# Patient Record
Sex: Male | Born: 1975 | Race: White | Hispanic: No | Smoking: Current every day smoker
Health system: Southern US, Community
[De-identification: ages and names within clinical notes are randomized; demographics above are authoritative.]

## PROBLEM LIST (undated history)

## (undated) DIAGNOSIS — J45909 Unspecified asthma, uncomplicated: Secondary | ICD-10-CM

---

## 2019-04-10 ENCOUNTER — Encounter (HOSPITAL_COMMUNITY): Payer: Self-pay | Admitting: Psychiatry

## 2019-04-10 ENCOUNTER — Other Ambulatory Visit: Payer: Self-pay

## 2019-04-10 ENCOUNTER — Observation Stay (HOSPITAL_COMMUNITY)
Admission: AD | Admit: 2019-04-10 | Discharge: 2019-04-10 | Disposition: A | Discharge status: Other Acute Inpatient Hospital | Payer: Self-pay | Attending: Psychiatry | Admitting: Psychiatry

## 2019-04-10 DIAGNOSIS — F333 Major depressive disorder, recurrent, severe with psychotic symptoms: Secondary | ICD-10-CM

## 2019-04-10 DIAGNOSIS — F209 Schizophrenia, unspecified: Principal | ICD-10-CM | POA: Insufficient documentation

## 2019-04-10 DIAGNOSIS — Z590 Homelessness: Secondary | ICD-10-CM | POA: Insufficient documentation

## 2019-04-10 DIAGNOSIS — Z20822 Contact with and (suspected) exposure to covid-19: Secondary | ICD-10-CM | POA: Insufficient documentation

## 2019-04-10 LAB — COMPREHENSIVE METABOLIC PANEL
ALT: 18 U/L (ref 0–44)
AST: 19 U/L (ref 15–41)
Albumin: 4 g/dL (ref 3.5–5.0)
Alkaline Phosphatase: 78 U/L (ref 38–126)
Anion gap: 8 (ref 5–15)
BUN: 8 mg/dL (ref 6–20)
CO2: 30 mmol/L (ref 22–32)
Calcium: 8.9 mg/dL (ref 8.9–10.3)
Chloride: 103 mmol/L (ref 98–111)
Creatinine, Ser: 0.74 mg/dL (ref 0.61–1.24)
GFR calc Af Amer: >60 mL/min (ref >60)
GFR calc non Af Amer: >60 mL/min (ref >60)
Glucose, Bld: 112 mg/dL — ABNORMAL HIGH (ref 70–99)
Potassium: 4.6 mmol/L (ref 3.5–5.1)
Sodium: 141 mmol/L (ref 135–145)
Total Bilirubin: 0.6 mg/dL (ref 0.3–1.2)
Total Protein: 7.4 g/dL (ref 6.5–8.1)

## 2019-04-10 LAB — CBC
HCT: 49.7 % (ref 39.0–52.0)
Hemoglobin: 16.7 g/dL (ref 13.0–17.0)
MCH: 30.6 pg (ref 26.0–34.0)
MCHC: 33.6 g/dL (ref 30.0–36.0)
MCV: 91 fL (ref 80.0–100.0)
Platelets: 328 10*3/uL (ref 150–400)
RBC: 5.46 MIL/uL (ref 4.22–5.81)
RDW: 13.9 % (ref 11.5–15.5)
WBC: 12.7 10*3/uL — ABNORMAL HIGH (ref 4.0–10.5)
nRBC: 0 % (ref 0.0–0.2)

## 2019-04-10 LAB — RESPIRATORY PANEL BY RT PCR (FLU A&B, COVID)
Influenza A by PCR: NEGATIVE
Influenza B by PCR: NEGATIVE
SARS Coronavirus 2 by RT PCR: NEGATIVE

## 2019-04-10 MED ORDER — ACETAMINOPHEN 325 MG PO TABS: 650.0000 mg | ORAL_TABLET | Freq: Four times a day (QID) | ORAL | Status: DC | PRN

## 2019-04-10 MED ORDER — HYDROXYZINE HCL 25 MG PO TABS
25.0000 mg | ORAL_TABLET | Freq: Three times a day (TID) | ORAL | Status: DC | PRN
  Administered 2019-04-10: 25 mg via ORAL
  Filled 2019-04-10: qty 1

## 2019-04-10 MED ORDER — RISPERIDONE 1 MG PO TABS
1.0000 mg | ORAL_TABLET | Freq: Two times a day (BID) | ORAL | Status: DC
  Administered 2019-04-10: 1 mg via ORAL
  Filled 2019-04-10: qty 1

## 2019-04-10 MED ORDER — MAGNESIUM HYDROXIDE 400 MG/5ML PO SUSP: 30.0000 mL | Freq: Every day | ORAL | Status: DC | PRN

## 2019-04-10 MED ORDER — ALUM & MAG HYDROXIDE-SIMETH 200-200-20 MG/5ML PO SUSP: 30.0000 mL | ORAL | Status: DC | PRN

## 2019-04-10 MED ORDER — TRAZODONE HCL 50 MG PO TABS: 50.0000 mg | ORAL_TABLET | Freq: Every evening | ORAL | Status: DC | PRN

## 2019-04-10 NOTE — Progress Notes (Signed)
Nira Conn, NP recommends inpt tx. TTS to seek placement. Pt to be admitted to [facility] OBS unit to await placement.

## 2019-04-10 NOTE — Progress Notes (Signed)
Patient ID: Ricky Watson, male   DOB: August 04, 1975, 44 y.o.   MRN: [id number]  Pt alert and oriented on the unit. Report given to Larita Fife, Charity fundraiser at [facility]. Safe transport called. Education, support, and encouragement provided, and pt's belongings in locker returned. Pt ambulatory on and off unit. Pt discharged to lobby with transport driver.

## 2019-04-10 NOTE — Progress Notes (Signed)
Pt accepted to [facility]  Dr. Angela Burke is the accepting provider.  Call report to [phone] Pt is Voluntary Pt may be transported by [employer] Pt scheduled to arrive as soon as transport has arrived.    Ruthann Cancer MSW, [facility] Clincal Social Worker Disposition  [facility] Ph: [phone] Fax: [phone]  04/10/2019 11:51 AM

## 2019-04-10 NOTE — Progress Notes (Signed)
Patient meets criteria for inpatient treatment per Berneice Heinrich, NP. No appropriate beds at [facility] currently. CSW faxed referrals to the following facilities for review:  [facility]   [facility]  [facility]  [facility]  [facility]  [facility]  [facility]  [facility]  [facility]  [facility]   [facility]  [facility]  [facility]   TTS will continue to seek bed placement.     Ruthann Cancer MSW, [facility] Clincal Social Worker Disposition  [facility] Ph: [phone] Fax: [phone] 04/10/2019 11:28 AM

## 2019-04-10 NOTE — Plan of Care (Signed)
[facility] Observation Crisis Plan  Reason for Crisis Plan:  Crisis Stabilization   Plan of Care:  Referral for Inpatient Hospitalization  Family Support:      Current Living Environment:  Living Arrangements: Alone  Insurance:   Hospital Account    Name Acct ID Class Status Primary Coverage   Ricky Watson [id number] BEHAVIORAL HEALTH OBSERVATION Open None        Guarantor Account (for Hospital Account [id number])    Name Relation to Pt Service Area Active? Acct Type   Ricky Watson, Will Self [facility] Yes Behavioral Health   Address Phone       [place], [place] [address] [phone](H)          Coverage Information (for Hospital Account [id number])    Not on file      Legal Guardian:     Primary Care Provider:  Patient, No Pcp Per  Current Outpatient Providers:  none  Psychiatrist:  Name of Psychiatrist: none  Counselor/Therapist:  Name of Therapist: none  Compliant with Medications:  No  Additional Information:   Ricky Watson 3/27/20216:19 AM
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
## 2019-04-10 NOTE — Progress Notes (Signed)
Patient ID: Ricky Watson, male   DOB: 1975-07-26, 44 y.o.   MRN: [id number] Pt A&O x 4, presents with SI, plan to cut his wrists.  Feeling hopeless.  Pt recently relocated from [place], and reports being laid off from job.  Pt reports he is homeless.  Denies HI, positive AVH.  Skin search completed.  Monitoring for safety.  Pt calm & cooperative, no distress noted.

## 2019-04-10 NOTE — H&P (Signed)
[facility] Observation Unit Provider Admission PAA/H&P  Patient Identification: Kashmere Staffa MRN:  [id number] Date of Evaluation:  04/10/2019 Chief Complaint:  Schizophrenia ([place]) [F20.9] Principal Diagnosis: Schizophrenia ([place]) Diagnosis:  Principal Problem:   Schizophrenia ([place])  History of Present Illness:  Ricky Assessment:  Ricky Watson is an 44 y.o. male who presents to [facility] as a walk in. Pt reports he has been experiencing SI with a plan to cut his wrists. Pt states he feels depressed and suicidal due to feelings of worthlessness, low mood, feeling hopeless, and frequent substance abuse. Pt states he moved to [place] from [place] in January to attend a sober living house. 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 Pt states he is now homeless. Pt states he has no supports or resources and has no reason to continue living. Pt states he has been experiencing AH that tell him do things. Pt also reports he experiences VH of deceased people. Pt states he feels paranoid and feels that people are after him. Pt states he does not have a current [place] provider, however he did see a [facility] provider in [place], [place].    Evaluation on Unit: Reviewed Ricky assessment and validated with patient. On evaluation patient is alert and oriented x 4, pleasant, and cooperative. Speech is clear and coherent. Mood is depressed and affect is congruent with mood. Thought process is coherent. States that he is hearing voices that tell him to kill himself. States that the voices have worsened over the past 3 weeks. No indication that he is responding to internal stimuli.  Reports suicidal ideations with plans to cut his wrists. Denies homicidal ideations. Reports occasional use of alcohol and methamphetamine. Reports last use of meth was 3 weeks ago. Reports that he last had a beer around 6 am on 04/09/2019.  Reports that he was taking Seroquel and trazodone although he does not recall the doses. States  that he has not taken medications in at least 3 weeks. Reports medical history of asthma.   Associated Signs/Symptoms: Depression Symptoms:  depressed mood, insomnia, feelings of worthlessness/guilt, difficulty concentrating, hopelessness, suicidal thoughts with specific plan, (Hypo) Manic Symptoms:  Hallucinations, Anxiety Symptoms:  Excessive Worry, Psychotic Symptoms:  Hallucinations: Auditory PTSD Symptoms: Negative Total Time spent with patient: 30 minutes  Past Psychiatric History: Reports history of schizophrenia and ADHD. No history of treatment at [facility] No records located in [place].   Is the patient at risk to self? Yes.    Has the patient been a risk to self in the past 6 months? No.  Has the patient been a risk to self within the distant past? Yes.    Is the patient a risk to others? No.  Has the patient been a risk to others in the past 6 months? No.  Has the patient been a risk to others within the distant past? No.   Prior Inpatient Therapy: Prior Inpatient Therapy: No Prior Outpatient Therapy: Prior Outpatient Therapy: Yes Prior Therapy Dates: unk Prior Therapy Facilty/Provider(s): facility in [place] Reason for Treatment: [place] issues Does patient have an ACCT team?: No Does patient have Intensive In-House Services?  : No Does patient have [facility] services? : No Does patient have P4CC services?: No  Alcohol Screening:   Substance Abuse History in the last 12 months:  Yes.   Consequences of Substance Abuse: Medical Consequences:  possible psyhcotic symptoms Previous Psychotropic Medications: Yes  Psychological Evaluations: Yes  Past Medical  History: History reviewed. No pertinent past medical history. History reviewed. No pertinent surgical history. Family History: History reviewed. No pertinent family history. Family Psychiatric History: unknown Tobacco Screening:   Social History:  Social History   Substance and Sexual Activity  Alcohol  Use None     Social History   Substance and Sexual Activity  Drug Use Not on file    Additional Social History: Marital status: Separated    Pain Medications: See MAR Prescriptions: See MAR Over the Counter: See MAR History of alcohol / drug use?: Yes Longest period of sobriety (when/how long): 2 years Negative Consequences of Use: Personal relationships Withdrawal Symptoms: Patient aware of relationship between substance abuse and physical/medical complications Name of Substance 1: Meth 1 - Age of First Use: 13 1 - Amount (size/oz): varies 1 - Frequency: weekly 1 - Duration: ongoing 1 - Last Use / Amount: 5 days ago Name of Substance 2: Alcohol 2 - Age of First Use: 9 2 - Amount (size/oz): 1 beer 2 - Frequency: rare 2 - Duration: ongoing 2 - Last Use / Amount: 04/09/2019                Allergies:  No Known Allergies Lab Results: No results found for this or any previous visit (from the past 48 hour(s)).  Blood Alcohol level:  No results found for: [facility]  Metabolic Disorder Labs:  No results found for: HGBA1C, MPG No results found for: PROLACTIN No results found for: CHOL, TRIG, HDL, CHOLHDL, VLDL, LDLCALC  Current Medications: Current Facility-Administered Medications  Medication Dose Route Frequency Provider Last Rate Last Admin  . acetaminophen (TYLENOL) tablet 650 mg  650 mg Oral Q6H PRN Jackelyn Poling, NP      . alum & mag hydroxide-simeth (MAALOX/MYLANTA) 200-200-20 MG/5ML suspension 30 mL  30 mL Oral Q4H PRN Nira Conn A, NP      . hydrOXYzine (ATARAX/VISTARIL) tablet 25 mg  25 mg Oral TID PRN Nira Conn A, NP      . magnesium hydroxide (MILK OF MAGNESIA) suspension 30 mL  30 mL Oral Daily PRN Nira Conn A, NP      . traZODone (DESYREL) tablet 50 mg  50 mg Oral QHS PRN Nira Conn A, NP       PTA Medications: No medications prior to admission.    Musculoskeletal: Strength & Muscle Tone: within normal limits Gait & Station: normal Patient  leans: N/A  Psychiatric Specialty Exam: Physical Exam  Constitutional: He is oriented to person, place, and time. He appears well-developed and well-nourished. No distress.  HENT:  Head: Normocephalic and atraumatic.  Right Ear: External ear normal.  Left Ear: External ear normal.  Eyes: Pupils are equal, round, and reactive to light. Right eye exhibits no discharge. Left eye exhibits no discharge.  Cardiovascular: Normal rate.  Respiratory: Effort normal. No respiratory distress.  Musculoskeletal:        General: Normal range of motion.  Neurological: He is alert and oriented to person, place, and time.  Skin: Skin is warm and dry. He is not diaphoretic.  Psychiatric: His mood appears anxious. He is not agitated, not aggressive and not withdrawn. Thought content is not paranoid and not delusional. He exhibits a depressed mood. He expresses suicidal ideation. He expresses no homicidal ideation. He expresses suicidal plans.    Review of Systems  Constitutional: Negative for activity change, appetite change, chills, diaphoresis, fatigue, fever and unexpected weight change.  HENT: Negative for congestion and sore throat.  Respiratory: Negative for cough and shortness of breath.   Cardiovascular: Negative for chest pain and palpitations.  Gastrointestinal: Negative for diarrhea, nausea and vomiting.  Psychiatric/Behavioral: Positive for decreased concentration, dysphoric mood, hallucinations, sleep disturbance and suicidal ideas. The patient is nervous/anxious.   All other systems reviewed and are negative.   Blood pressure 109/87, pulse (!) 111, temperature 98.2 F (36.8 C), temperature source Oral, resp. rate 20.There is no height or weight on file to calculate BMI.  General Appearance: Casual and Fairly Groomed  Eye Contact:  Fair  Speech:  Clear and Coherent and Normal Rate  Volume:  Decreased  Mood:  Anxious, Depressed, Hopeless and Worthless  Affect:  Congruent and Depressed   Thought Process:  Coherent, Goal Directed, Linear and Descriptions of Associations: Intact  Orientation:  Full (Time, Place, and Person)  Thought Content:  Hallucinations: Command:  voices tell him to kill himself  Suicidal Thoughts:  Yes.  with intent/plan  Homicidal Thoughts:  No  Memory:  Immediate;   Fair Recent;   Fair Remote;   Fair  Judgement:  Impaired  Insight:  Lacking  Psychomotor Activity:  Normal  Concentration:  Concentration: Fair and Attention Span: Fair  Recall:  [employer] of Knowledge:  Good  Language:  Good  Akathisia:  Negative  Handed:  Right  AIMS (if indicated):     Assets:  Communication Skills Desire for Improvement Leisure Time Physical Health  ADL's:  Intact  Cognition:  WNL  Sleep:         Treatment Plan Summary: Daily contact with patient to assess and evaluate symptoms and progress in treatment and Medication management  Observation Level/Precautions:  15 minute checks Laboratory:  CBC Chemistry Profile UDS Psychotherapy:  Individual Medications:   Hydroxyzine 25 mg TID prn for anxiety Trazodone 50 mg QHS prn for sleep Consultations:  social work Discharge Concerns:  Safety, medication adeherence Estimated LOS: Other:      Jackelyn Poling, NP 3/27/20216:20 AM

## 2019-05-13 ENCOUNTER — Ambulatory Visit (HOSPITAL_COMMUNITY)
Admission: RE | Admit: 2019-05-13 | Discharge: 2019-05-13 | Disposition: A | Discharge status: Home / Self Care | Payer: Self-pay | Attending: Psychiatry | Admitting: Psychiatry

## 2019-05-13 DIAGNOSIS — R45851 Suicidal ideations: Secondary | ICD-10-CM | POA: Insufficient documentation

## 2019-05-13 DIAGNOSIS — F1510 Other stimulant abuse, uncomplicated: Secondary | ICD-10-CM | POA: Insufficient documentation

## 2019-05-13 DIAGNOSIS — Z590 Homelessness: Secondary | ICD-10-CM | POA: Insufficient documentation

## 2019-05-13 DIAGNOSIS — Z915 Personal history of self-harm: Secondary | ICD-10-CM | POA: Insufficient documentation

## 2019-05-13 DIAGNOSIS — F1020 Alcohol dependence, uncomplicated: Secondary | ICD-10-CM | POA: Insufficient documentation

## 2019-05-13 DIAGNOSIS — F333 Major depressive disorder, recurrent, severe with psychotic symptoms: Secondary | ICD-10-CM | POA: Insufficient documentation

## 2019-05-13 MED ORDER — ACETAMINOPHEN 325 MG PO TABS
650.00 | ORAL_TABLET | ORAL | Status: DC
Start: ? — End: 2019-05-13

## 2019-05-13 MED ORDER — GUAIFENESIN 100 MG/5ML PO SYRP
200.00 | ORAL_SOLUTION | ORAL | Status: DC
Start: ? — End: 2019-05-13

## 2019-05-13 MED ORDER — BISACODYL 5 MG PO TBEC
10.00 | DELAYED_RELEASE_TABLET | ORAL | Status: DC
Start: ? — End: 2019-05-13

## 2019-05-13 MED ORDER — ALUM & MAG HYDROXIDE-SIMETH 200-200-20 MG/5ML PO SUSP
30.00 | ORAL | Status: DC
Start: ? — End: 2019-05-13

## 2019-05-13 MED ORDER — CETIRIZINE HCL 10 MG PO TABS
10.00 | ORAL_TABLET | ORAL | Status: DC
Start: ? — End: 2019-05-13

## 2019-05-13 MED ORDER — CLONIDINE HCL 0.1 MG PO TABS
0.10 | ORAL_TABLET | ORAL | Status: DC
Start: ? — End: 2019-05-13

## 2019-05-13 MED ORDER — RISPERIDONE 1 MG PO TABS
0.50 | ORAL_TABLET | ORAL | Status: DC
Start: 2019-05-12 — End: 2019-05-13

## 2019-05-13 MED ORDER — FLUOXETINE HCL 10 MG PO CAPS
10.00 | ORAL_CAPSULE | ORAL | Status: DC
Start: 2019-05-13 — End: 2019-05-13

## 2019-05-13 MED ORDER — TRAZODONE HCL 100 MG PO TABS
100.00 | ORAL_TABLET | ORAL | Status: DC
Start: ? — End: 2019-05-13

## 2019-05-13 MED ORDER — SALINE NASAL SPRAY 0.65 % NA SOLN
1.00 | NASAL | Status: DC
Start: ? — End: 2019-05-13

## 2019-05-13 MED ORDER — GENERIC EXTERNAL MEDICATION
Status: DC
Start: ? — End: 2019-05-13

## 2019-05-13 MED ORDER — HYDROXYZINE HCL 25 MG PO TABS
50.00 | ORAL_TABLET | ORAL | Status: DC
Start: ? — End: 2019-05-13

## 2019-05-13 MED ORDER — BENZOCAINE-MENTHOL 6-10 MG MT LOZG
1.00 | LOZENGE | OROMUCOSAL | Status: DC
Start: ? — End: 2019-05-13

## 2019-05-13 MED ORDER — DIPHENHYDRAMINE HCL 25 MG PO CAPS
50.00 | ORAL_CAPSULE | ORAL | Status: DC
Start: ? — End: 2019-05-13

## 2019-05-13 MED ORDER — ONDANSETRON 8 MG PO TBDP
8.00 | ORAL_TABLET | ORAL | Status: DC
Start: ? — End: 2019-05-13

## 2019-05-13 MED ORDER — DIPHENHYDRAMINE HCL 50 MG/ML IJ SOLN
50.00 | INTRAMUSCULAR | Status: DC
Start: ? — End: 2019-05-13

## 2019-05-13 MED ORDER — LORAZEPAM 2 MG/ML IJ SOLN
2.00 | INTRAMUSCULAR | Status: DC
Start: ? — End: 2019-05-13

## 2019-05-13 MED ORDER — HALOPERIDOL 5 MG PO TABS
5.00 | ORAL_TABLET | ORAL | Status: DC
Start: ? — End: 2019-05-13

## 2019-05-13 MED ORDER — NICOTINE 21 MG/24HR TD PT24
1.00 | MEDICATED_PATCH | TRANSDERMAL | Status: DC
Start: 2019-05-13 — End: 2019-05-13

## 2019-05-13 MED ORDER — QUINTABS PO TABS
1.00 | ORAL_TABLET | ORAL | Status: DC
Start: 2019-05-13 — End: 2019-05-13

## 2019-05-13 MED ORDER — GENERIC EXTERNAL MEDICATION
5.00 | Status: DC
Start: ? — End: 2019-05-13

## 2019-05-13 MED ORDER — NICOTINE POLACRILEX 2 MG MT GUM
2.00 | CHEWING_GUM | OROMUCOSAL | Status: DC
Start: ? — End: 2019-05-13

## 2019-05-13 MED ORDER — LOPERAMIDE HCL 2 MG PO CAPS
2.00 | ORAL_CAPSULE | ORAL | Status: DC
Start: ? — End: 2019-05-13

## 2019-05-13 NOTE — BH Assessment (Signed)
Assessment Note  Ricky Watson is an 44 y.o. male present to [facility] unaccompanied as a walk-in. Patient complains of suicidal ideations triggered by auditory hallucinations. Report, "I have voices saying 'you not worth it', 'I should just kill myself', and 'life would be better without being here." Patient report plan to cut himself. Report he has been hearing voices off and on since the age of 44 year old. Presently he has hearing voices the past two days which triggered his suicidal ideations. Patient report history of poly-substance abuse of meth, heroin, and alcohol. He denied meth and heroin use within the last 3-weeks and alcohol within the last 2-days. Denied homicidal ideations, and denied visual hallucinations. Per chart review, patient was admitted to [facility] observation unit 04/10/2019 with a similar presentation (SI with plan to cut wrist). He stated following overnight observation, he went to [facility] where he received mental health treatment. He denied additional ED visits or psychiatric hospitalization after his discharge from [facility] although following chart review, patient presented to [facility] in [place] on 04/26/2019 and [facility] in [place] on 05/09/2019 which he was discharged 05/10/2019. Per chart review patient has not followed up with treatment recommendations of attending [facility] [place] [facility] management (04/30/2019), [facility] (05/12/2019 @9am ) and [facility] (05/12/2019 @ 3pm). During the times of admission to [facility] in [facility] patient behaviors were documented as malingering. When asked why he has not followed up on recommendation patient stated he's orginially from [facility] and he came to [place] 01/29/2019 to participate in the program [facility] 01/31/2019. Report he kicked out of the program due to relapse. Report when he came to [facility] 04/10/2019 he was unable to follow  up with treatment recommendation because the director of the time stated his appointment conflicted with his work schedule. Patient did not disclose attending [facility] in [place].   Patient mood depressed, speech clear, coherent and within normal rate. Patient dressed appropriately for weather. He report decreased sleep getting about 2 hours and ok appetite.   Diagnosis:  MDD, recurrent, severe, w/ psychosis; Stimulant use d/o, severe; Alcohol use d/o, severe  Past Medical History: No past medical history on file.  No past surgical history on file.  Family History: No family history on file.  Social History:  reports that he has been smoking. He uses smokeless tobacco. No history on file for alcohol and drug.  Additional Social History:  Alcohol / Drug Use Pain Medications: see MAR Prescriptions: see MAR Over the Counter: see MAR History of alcohol / drug use?: Yes Substance #1 Name of Substance 1: Meth 1 - Age of First Use: 22 1 - Amount (size/oz): unknown 1 - Frequency: daily 1 - Duration: on-going 1 - Last Use / Amount: 3 weeks ago Substance #2 Name of Substance 2: Heroin 2 - Age of First Use: 24 2 - Amount (size/oz): unknown 2 - Frequency: daily 2 - Duration: ongoing 2 - Last Use / Amount: 3 weeks agp Substance #3 Name of Substance 3: Alcohol 3 - Age of First Use: 9 3 - Amount (size/oz): unknown 3 - Frequency: daily 3 - Duration: on-going 3 - Last Use / Amount: 2 days ago  CIWA: CIWA-Ar BP: 98/70 Pulse Rate: 83 COWS:    Allergies: No Known Allergies  Home Medications: (Not in a hospital admission)   OB/GYN Status:  No LMP for male patient.  General Assessment  Data Location of Assessment: [facility] Assessment Services TTS Assessment: In system Is this a Tele or Face-to-Face Assessment?: Face-to-Face Is this an Initial Assessment or a Re-assessment for this encounter?: Initial Assessment Patient Accompanied by:: N/A Language Other than English:  No Living Arrangements: Homeless/Shelter What gender do you identify as?: Male Marital status: Separated Pregnancy Status: No Living Arrangements: Alone Can pt return to current living arrangement?: Yes Admission Status: Voluntary Is patient capable of signing voluntary admission?: Yes Referral Source: Self/Family/Friend Insurance type: none   Medical Screening Exam [facility] Walk-in ONLY) Medical Exam completed: Yes  Crisis Care Plan Living Arrangements: Alone Name of Psychiatrist: none Name of Therapist: none  Education Status Is patient currently in school?: No Is the patient employed, unemployed or receiving disability?: Employed  Risk to self with the past 6 months Suicidal Ideation: Yes-Currently Present Has patient been a risk to self within the past 6 months prior to admission? : Yes Suicidal Intent: Yes-Currently Present Has patient had any suicidal intent within the past 6 months prior to admission? : Yes Is patient at risk for suicide?: Yes Suicidal Plan?: Yes-Currently Present Has patient had any suicidal plan within the past 6 months prior to admission? : Yes Specify Current Suicidal Plan: patient report plan to cut himself Access to Means: Yes Specify Access to Suicidal Means: patient has access to shart objects What has been your use of drugs/alcohol within the last 12 months?: meth, heroin, alcohol Previous Attempts/Gestures: Yes How many times?: (report 4x or 5x attempts ) Other Self Harm Risks: hx of poly substance use Triggers for Past Attempts: Other personal contacts Intentional Self Injurious Behavior: None Family Suicide History: No Recent stressful life event(s): (death of daughter (44-days old), homeless) Persecutory voices/beliefs?: No Depression: Yes Depression Symptoms: Despondent, Feeling worthless/self pity, Insomnia Substance abuse history and/or treatment for substance abuse?: Yes Suicide prevention information given to non-admitted patients:  Not applicable  Risk to Others within the past 6 months Homicidal Ideation: No Does patient have any lifetime risk of violence toward others beyond the six months prior to admission? : No Thoughts of Harm to Others: No Current Homicidal Intent: No Current Homicidal Plan: No Access to Homicidal Means: No History of harm to others?: No Assessment of Violence: None Noted Violent Behavior Description: None Noted Does patient have access to weapons?: No Criminal Charges Pending?: No Does patient have a court date: No Is patient on probation?: No  Psychosis Hallucinations: Auditory Delusions: None noted  Mental Status Report Appearance/Hygiene: Poor hygiene Eye Contact: Fair Motor Activity: Freedom of movement Speech: Logical/coherent Level of Consciousness: Alert Mood: Depressed, Pleasant Affect: Depressed Anxiety Level: None Thought Processes: Coherent, Relevant Judgement: Impaired Orientation: Person, Place, Time, Situation, Appropriate for developmental age Obsessive Compulsive Thoughts/Behaviors: None  Cognitive Functioning Concentration: Normal Memory: Recent Intact, Remote Intact Is patient IDD: No Insight: Poor Impulse Control: Poor Appetite: Fair Have you had any weight changes? : No Change Sleep: No Change Total Hours of Sleep: 2(report 2 hours ) Vegetative Symptoms: None  ADLScreening [facility] Assessment Services) Patient's cognitive ability adequate to safely complete daily activities?: Yes Patient able to express need for assistance with ADLs?: Yes Independently performs ADLs?: Yes (appropriate for developmental age)  Prior Inpatient Therapy Prior Inpatient Therapy: No  Prior Outpatient Therapy Prior Outpatient Therapy: Yes Prior Therapy Dates: unk Prior Therapy Facilty/Provider(s): facility in [place] Reason for Treatment: MH issues Does patient have an ACCT team?: No Does patient have Intensive In-House Services?  : No Does patient have [place]  services? :  No Does patient have P4CC services?: No  ADL Screening (condition at time of admission) Patient's cognitive ability adequate to safely complete daily activities?: Yes Is the patient deaf or have difficulty hearing?: No Does the patient have difficulty seeing, even when wearing glasses/contacts?: No Does the patient have difficulty concentrating, remembering, or making decisions?: No Patient able to express need for assistance with ADLs?: Yes Does the patient have difficulty dressing or bathing?: No Independently performs ADLs?: Yes (appropriate for developmental age) Does the patient have difficulty walking or climbing stairs?: No       Abuse/Neglect Assessment (Assessment to be complete while patient is alone) Abuse/Neglect Assessment Can Be Completed: Yes Physical Abuse: Denies Verbal Abuse: Denies Sexual Abuse: Denies Exploitation of patient/patient's resources: Denies Self-Neglect: Denies     Merchant navy officer (For Healthcare) Does Patient Have a Medical Advance Directive?: No Would patient like information on creating a medical advance directive?: No - Patient declined          Disposition:  Disposition Initial Assessment Completed for this Encounter: Yes(LaShunda Thomas,NP, pt does not meet inpt criteria ) Disposition of Patient: Discharge(LaShunda Thomas,NP, pt does not qualify for inpt tx )  On Site Evaluation by:   Reviewed with Physician:    Dian Situ 05/13/2019 11:01 AM

## 2019-05-13 NOTE — H&P (Signed)
Behavioral Health Medical Screening Exam  Ricky Watson is an 44 y.o. male.who presented to [facility] voluntarily, as a walk-in. HIS PMH includes; depression, alcohol dependence, meth, and herion abuse. Patient endorsed SI stating," I am just sick of life." When exploring the comment made, he stated," I am homeless and have no job." He spoke to TSS counselor prior to this evaluation and stated he had a plan to cut his wrist. He stated no plan during this evaluation. He added, he had been living in a sober living facility although he left the facility 3 weeks because they told him he had to work. Stated since then, he has been living," here and there." Per chart review, patient was admitted to [facility] observation unit 04/10/2019 with a similar presentation (SI with plan to cut wrist). He stated following overnight observation, he went to [facility] where he received mental health treatment. He denied additional ED visits or psychiatric hospitalization after his discharge from [facility] although following chart review, patient presented to [facility] in [place] on 04/26/2019 and [facility] in [place] on 05/09/2019 which he was discharged 05/10/2019. When questioned about this, he stated," I don't remember" then stated," I think I remember the first one but I don't remember the last one." During the times of those admission,patient behaviors were documented as malingering. Patient endorsed AH described as," hearing voices telling me that I am worthless and my life would be a lot better if I were no here." He added," sometimes, we just talk." He does not appear psychotic. Following review of chart, it was noted that patient had follow up with [facility] 05/11/2019 and when asked if he went and why not, he initially stated they turned him down because he did not have a state ID. When advised that I would give [facility] a call he replied," I didn't go because I was paranoid." He also  [facility] and when asked why he did not go he replied," because they told me I had to come from a [employer]."  He denied homicidal thoughts. He stated that he had not used meth or heroin in three weeks but drank 1 beer today. He denied access to firearms.  Denied recently self-harming behaviors and suicde attempts although stated he has had numerous suicide attempts in the past with last one last year. When asked about numerous events, his stories seemed to be conflicting. There is high suspicion for malingering behaviors.   When asked if there was someone I could speak to for collateral he declined to provide information.   Total Time spent with patient: 30 minutes  Psychiatric Specialty Exam: Physical Exam  Vitals reviewed. Constitutional: He is oriented to person, place, and time.  Neurological: He is alert and oriented to person, place, and time.    Review of Systems  Psychiatric/Behavioral: Positive for hallucinations and suicidal ideas.    Blood pressure 98/70, pulse 83, temperature 97.9 F (36.6 C), temperature source Oral, resp. rate 18, SpO2 98 %.There is no height or weight on file to calculate BMI.  General Appearance: Fairly Groomed  Eye Contact:  Fair  Speech:  Clear and Coherent and Normal Rate  Volume:  Normal  Mood:  Depressed  Affect:  Appropriate  Thought Process:  Coherent, Linear and Descriptions of Associations: Intact  Orientation:  Full (Time, Place, and Person)  Thought Content:  Hallucinations: Auditory; does not appear internally preoccupied.  Suicidal Thoughts:  Yes.  without intent/plan  Homicidal Thoughts:  No  Memory:  Immediate;   Fair Recent;   Fair  Judgement:  Impaired  Insight:  Lacking  Psychomotor Activity:  Normal  Concentration: Concentration: Fair and Attention Span: Fair  Recall:  [employer] of Knowledge:Fair  Language: Good  Akathisia:  Negative  Handed:  Right  AIMS (if  indicated):     Assets:  Communication Skills Desire for Improvement  Sleep:       Musculoskeletal: Strength & Muscle Tone: within normal limits Gait & Station: normal Patient leans: N/A  Blood pressure 98/70, pulse 83, temperature 97.9 F (36.6 C), temperature source Oral, resp. rate 18, SpO2 98 %.  Recommendations:  Based on my evaluation the patient does not appear to have an emergency medical condition.   There is high suspicion for malingering with secondary gain associated  with homelessness. There is no evidence of imminent risk to self or others at present.   Patient does not meet criteria for psychiatric inpatient admission. We did discuss other options for housing and substance abuse services and he was open to going to [facility]. CSW involved in starting the process.      Denzil Magnuson, NP 05/13/2019, 10:37 AM

## 2019-06-08 ENCOUNTER — Other Ambulatory Visit: Payer: Self-pay

## 2019-06-08 ENCOUNTER — Emergency Department (HOSPITAL_COMMUNITY)
Admission: EM | Admit: 2019-06-08 | Discharge: 2019-06-10 | Disposition: A | Discharge status: Home / Self Care | Payer: Self-pay | Attending: Emergency Medicine | Admitting: Emergency Medicine

## 2019-06-08 ENCOUNTER — Encounter (HOSPITAL_COMMUNITY): Payer: Self-pay | Admitting: Emergency Medicine

## 2019-06-08 DIAGNOSIS — Z79899 Other long term (current) drug therapy: Secondary | ICD-10-CM | POA: Insufficient documentation

## 2019-06-08 DIAGNOSIS — F333 Major depressive disorder, recurrent, severe with psychotic symptoms: Secondary | ICD-10-CM | POA: Insufficient documentation

## 2019-06-08 DIAGNOSIS — R45851 Suicidal ideations: Secondary | ICD-10-CM | POA: Insufficient documentation

## 2019-06-08 DIAGNOSIS — F17200 Nicotine dependence, unspecified, uncomplicated: Secondary | ICD-10-CM | POA: Insufficient documentation

## 2019-06-08 DIAGNOSIS — Z20822 Contact with and (suspected) exposure to covid-19: Secondary | ICD-10-CM | POA: Insufficient documentation

## 2019-06-08 DIAGNOSIS — R440 Auditory hallucinations: Secondary | ICD-10-CM

## 2019-06-08 DIAGNOSIS — Z046 Encounter for general psychiatric examination, requested by authority: Secondary | ICD-10-CM | POA: Insufficient documentation

## 2019-06-08 LAB — COMPREHENSIVE METABOLIC PANEL
ALT: 13 U/L (ref 0–44)
AST: 14 U/L — ABNORMAL LOW (ref 15–41)
Albumin: 4.5 g/dL (ref 3.5–5.0)
Alkaline Phosphatase: 64 U/L (ref 38–126)
Anion gap: 10 (ref 5–15)
BUN: 14 mg/dL (ref 6–20)
CO2: 28 mmol/L (ref 22–32)
Calcium: 9.1 mg/dL (ref 8.9–10.3)
Chloride: 104 mmol/L (ref 98–111)
Creatinine, Ser: 0.97 mg/dL (ref 0.61–1.24)
GFR calc Af Amer: >60 mL/min (ref >60)
GFR calc non Af Amer: >60 mL/min (ref >60)
Glucose, Bld: 95 mg/dL (ref 70–99)
Potassium: 4.4 mmol/L (ref 3.5–5.1)
Sodium: 142 mmol/L (ref 135–145)
Total Bilirubin: 0.5 mg/dL (ref 0.3–1.2)
Total Protein: 7.6 g/dL (ref 6.5–8.1)

## 2019-06-08 LAB — RAPID URINE DRUG SCREEN, HOSP PERFORMED
Amphetamines: NOT DETECTED
Barbiturates: NOT DETECTED
Benzodiazepines: NOT DETECTED
Cocaine: NOT DETECTED
Opiates: NOT DETECTED
Tetrahydrocannabinol: NOT DETECTED

## 2019-06-08 LAB — CBC
HCT: 49 % (ref 39.0–52.0)
Hemoglobin: 16.2 g/dL (ref 13.0–17.0)
MCH: 30.7 pg (ref 26.0–34.0)
MCHC: 33.1 g/dL (ref 30.0–36.0)
MCV: 93 fL (ref 80.0–100.0)
Platelets: 276 10*3/uL (ref 150–400)
RBC: 5.27 MIL/uL (ref 4.22–5.81)
RDW: 13.7 % (ref 11.5–15.5)
WBC: 9.7 10*3/uL (ref 4.0–10.5)
nRBC: 0 % (ref 0.0–0.2)

## 2019-06-08 LAB — SALICYLATE LEVEL: Salicylate Lvl: <7 mg/dL — ABNORMAL LOW (ref 7.0–30.0)

## 2019-06-08 LAB — ETHANOL: Alcohol, Ethyl (B): <10 mg/dL (ref <10)

## 2019-06-08 LAB — ACETAMINOPHEN LEVEL: Acetaminophen (Tylenol), Serum: <10 ug/mL — ABNORMAL LOW (ref 10–30)

## 2019-06-08 NOTE — ED Provider Notes (Signed)
[place] DEPT Provider Note   CSN: [id number] Arrival date & time: 06/08/19  1253     History Chief Complaint  Patient presents with  . Hallucinations  . Suicidal    Ricky Watson is a 44 y.o. male.  HPI    Patient presents with concern of suicidal ideation, auditory hallucination. He notes that he has a history of auditory elucidation of back to when he was a teenager, this is essentially unchanged, with a myriad of voices telling him different commands, including to kill himself. He notes that about 5 days ago he began feeling suicidal, since that time he has had persistent suicidal ideation. Acknowledges history of prior alcohol abuse, states that he has not had anything drink in about 1 month.  He has had prior hospitalizations within the past year as well. He denies physical pain, complaints.  History reviewed. No pertinent past medical history.  Patient Active Problem List   Diagnosis Date Noted  . Schizophrenia ([place]) 04/10/2019  . Major depressive disorder, recurrent, severe with psychotic features ([place]) 04/10/2019    History reviewed. No pertinent surgical history.     No family history on file.  Social History   Tobacco Use  . Smoking status: Current Every Day Smoker  . Smokeless tobacco: Current User  Substance Use Topics  . Alcohol use: Not Currently  . Drug use: Not Currently    Home Medications Prior to Admission medications   Medication Sig Start Date End Date Taking? Authorizing Provider  QUEtiapine Fumarate (SEROQUEL PO) Take by mouth.    [provider]  traZODone (DESYREL) 100 MG tablet Take by mouth at bedtime.    [provider]    Allergies    Patient has no known allergies.  Review of Systems   Review of Systems  Constitutional:       Per HPI, otherwise negative  HENT:       Per HPI, otherwise negative  Respiratory:       Per HPI, otherwise negative  Cardiovascular:   Per HPI, otherwise negative  Gastrointestinal: Negative for vomiting.  Endocrine:       Negative aside from HPI  Genitourinary:       Neg aside from HPI   Musculoskeletal:       Per HPI, otherwise negative  Skin: Negative.   Neurological: Negative for syncope.  Psychiatric/Behavioral: Positive for hallucinations and suicidal ideas.    Physical Exam Updated Vital Signs BP 112/83 (BP Location: Right Arm)   Pulse 89   Temp 98.9 F (37.2 C) (Oral)   Resp 19   SpO2 100%   Physical Exam Vitals and nursing note reviewed.  Constitutional:      General: He is not in acute distress.    Appearance: He is well-developed.  HENT:     Head: Normocephalic and atraumatic.  Eyes:     Conjunctiva/sclera: Conjunctivae normal.  Cardiovascular:     Rate and Rhythm: Normal rate and regular rhythm.  Pulmonary:     Effort: Pulmonary effort is normal. No respiratory distress.     Breath sounds: No stridor.  Abdominal:     General: There is no distension.  Skin:    General: Skin is warm and dry.  Neurological:     Mental Status: He is alert and oriented to person, place, and time.  Psychiatric:        Mood and Affect: Mood normal.        Behavior: Behavior normal.  Thought Content: Thought content includes suicidal ideation.     ED Results / Procedures / Treatments   Labs (all labs ordered are listed, but only abnormal results are displayed) Labs Reviewed  COMPREHENSIVE METABOLIC PANEL - Abnormal; Notable for the following components:      Result Value   AST 14 (*)    All other components within normal limits  SALICYLATE LEVEL - Abnormal; Notable for the following components:   Salicylate Lvl <8.9 (*)    All other components within normal limits  ACETAMINOPHEN LEVEL - Abnormal; Notable for the following components:   Acetaminophen (Tylenol), Serum <10 (*)    All other components within normal limits  ETHANOL  CBC  RAPID URINE DRUG SCREEN, HOSP PERFORMED     Procedures Procedures (including critical care time)  I reviewed the patient's chart including documentation from another local healthcare facility for visit see me within the past few days, also for behavioral health issue.  ED Course  I have reviewed the triage vital signs and the nursing notes.  Pertinent labs & imaging results that were available during my care of the patient were reviewed by me and considered in my medical decision making (see chart for details).   This patient presents with the need for medical evaluation due to ongoing psychiatric condition.  The patient's medical portion of the evaluation is generally reassuring, with no evidence of acute new pathology.  The patient has been medically cleared for further psychiatric evaluation. Clinical Impression(s) / ED Diagnoses Final diagnoses:  Suicidal ideation  Auditory hallucination     Carmin Muskrat, MD 06/08/19 [phone]

## 2019-06-08 NOTE — ED Triage Notes (Signed)
Patient here from [facility] of [place] with complaints of hallucinations and suicidal ideation that started on Saturday. Reports voices are telling him to hurt self because shes not worth it.
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
## 2019-06-09 LAB — SARS CORONAVIRUS 2 BY RT PCR (HOSPITAL ORDER, PERFORMED IN ~~LOC~~ HOSPITAL LAB): SARS Coronavirus 2: NEGATIVE

## 2019-06-09 NOTE — Patient Outreach (Signed)
ED Peer Support Specialist Patient Intake (Complete at intake & 30-60 Day Follow-up)  Name: Ricky Watson  MRN: [id number]  Age: 44 y.o.   Date of Admission: 06/09/2019  Intake: Initial Comments:      Primary Reason Admitted: Suicidal ideation Auditory hallucination   Lab values: Alcohol/ETOH: Not completed Positive UDS? No Amphetamines: No Barbiturates: No Benzodiazepines: No Cocaine: No Opiates: No Cannabinoids: No  Demographic information: Gender: Male Ethnicity: White Marital Status: Divorced Insurance Status: Uninsured/Self-pay Control and instrumentation engineer (Work Engineer, agricultural, Sales executive, Catering manager.: Yes(Food Veterinary surgeon) Lives with: Alone Living situation: Transitional housing  Reported Patient History: Patient reported health conditions: Bipolar disorder, Depression, Schizoaffective disorder Patient aware of HIV and hepatitis status: No  In past year, has patient visited ED for any reason? No  Number of ED visits:    Reason(s) for visit:    In past year, has patient been hospitalized for any reason? No  Number of hospitalizations:    Reason(s) for hospitalization:    In past year, has patient been arrested? No  Number of arrests:    Reason(s) for arrest:    In past year, has patient been incarcerated? No  Number of incarcerations:    Reason(s) for incarceration:    In past year, has patient received medication-assisted treatment? No  In past year, patient received the following treatments:    In past year, has patient received any harm reduction services? No  Did this include any of the following?    In past year, has patient received care from a mental health provider for diagnosis other than SUD? No  In past year, is this first time patient has overdosed? No  Number of past overdoses:    In past year, is this first time patient has been hospitalized for an overdose? No  Number of hospitalizations for overdose(s):    Is patient  currently receiving treatment for a mental health diagnosis? No  Patient reports experiencing difficulty participating in SUD treatment: No    Most important reason(s) for this difficulty?    Has patient received prior services for treatment? No  In past, patient has received services from following agencies:    Plan of Care:  Suggested follow up at these agencies/treatment centers: Other (comment)  Other information: [facility] was made aware that Pt wants to get back home to [facility]. [facility] processed with Pt about a few options that may be of assistance for Pt.     Arlys John Griselda Bramblett, CPSS  06/09/2019 11:09 AM

## 2019-06-09 NOTE — ED Notes (Signed)
Called TTS to follow up. Consult machine placed at bedside 

## 2019-06-09 NOTE — ED Notes (Addendum)
Per [employer], Ricky Watson reports SW is working to get pt transportation back to [place]
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
## 2019-06-09 NOTE — BH Assessment (Signed)
[facility] Assessment Progress Note  Per Caryn Bee, DNP, this pt does not require psychiatric hospitalization at this time.  Pt is to be discharged from [facility].  No local outpatient referrals are indicated, however, pt would benefit from Peer Support and Social Work intervention, and consults have been ordered.  Pt's nurse has been notified.  Doylene Canning, MA Triage Specialist [phone]

## 2019-06-10 NOTE — ED Provider Notes (Signed)
Patient appears to have been cleared by psychiatry yesterday.  He is not suicidal on my exam.  He appears stable for discharge.   Pricilla Loveless, MD 06/10/19 [phone]

## 2019-06-10 NOTE — Progress Notes (Signed)
TOC CM attempted call to pt's number listed. Unable to leave message. Isidoro Donning RN CCM, [facility] ED TOC CM [phone]

## 2019-06-10 NOTE — ED Notes (Signed)
Meal tray provided.

## 2019-06-10 NOTE — Progress Notes (Signed)
Escorted Ricky Watson to the SAPPU area and oriented him to his environment. He received a snack of his choice and drifted off to sleep. He slept throughout the night.

## 2019-06-10 NOTE — ED Notes (Signed)
Pt DCd off unit to home. Pt alert, calm, cooperative, no s/s of distress. DC information and resources given to pt and reviewed with pt, pt acknowledged information. Belongings given to pt. Pt ambulatory off unit, escorted by NT.

## 2019-06-10 NOTE — ED Notes (Signed)
PT was directed with EMT to bathroom to change into belongings, PT was provided belongings, PT was directed to entrance for discharge.

## 2020-07-01 ENCOUNTER — Other Ambulatory Visit: Payer: Self-pay

## 2020-07-01 ENCOUNTER — Emergency Department (HOSPITAL_COMMUNITY)
Admission: EM | Admit: 2020-07-01 | Discharge: 2020-07-01 | Disposition: A | Discharge status: Home / Self Care | Payer: Self-pay | Attending: Emergency Medicine | Admitting: Emergency Medicine

## 2020-07-01 ENCOUNTER — Encounter (HOSPITAL_COMMUNITY): Payer: Self-pay | Admitting: Emergency Medicine

## 2020-07-01 ENCOUNTER — Emergency Department (HOSPITAL_COMMUNITY): Payer: Self-pay

## 2020-07-01 DIAGNOSIS — J439 Emphysema, unspecified: Secondary | ICD-10-CM | POA: Insufficient documentation

## 2020-07-01 DIAGNOSIS — F17200 Nicotine dependence, unspecified, uncomplicated: Secondary | ICD-10-CM | POA: Insufficient documentation

## 2020-07-01 MED ORDER — DEXAMETHASONE 4 MG PO TABS
10.0000 mg | ORAL_TABLET | Freq: Once | ORAL | Status: AC
  Administered 2020-07-01: 10 mg via ORAL
  Filled 2020-07-01: qty 2

## 2020-07-01 MED ORDER — ALBUTEROL SULFATE HFA 108 (90 BASE) MCG/ACT IN AERS
2.0000 | INHALATION_SPRAY | Freq: Once | RESPIRATORY_TRACT | Status: AC
  Administered 2020-07-01: 2 via RESPIRATORY_TRACT
  Filled 2020-07-01: qty 6.7

## 2020-07-01 MED ORDER — ALBUTEROL SULFATE HFA 108 (90 BASE) MCG/ACT IN AERS: 2.0000 | INHALATION_SPRAY | Freq: Four times a day (QID) | RESPIRATORY_TRACT | 0 refills | Status: AC | PRN

## 2020-07-01 NOTE — ED Triage Notes (Signed)
Patient here from home reporting cough x3 days increased this morning. Reports hx of asthma, no inhaler.

## 2020-07-01 NOTE — ED Provider Notes (Signed)
[facility]-EMERGENCY DEPT Provider Note   CSN: [id number] Arrival date & time: 07/01/20  0447     History Chief Complaint  Patient presents with   Cough    Ricky Watson is a 45 y.o. male.  45 year old male with history of asthma presents to the emergency department for evaluation of cough and shortness of breath.  He has been experiencing issues with this in the middle of the night, usually around 2 AM.  He has not had his inhaler to use in the past 2 months.  Has had some mild nasal congestion.  No fevers, sore throat, chills, chest pain.  The history is provided by the patient. No language interpreter was used.  Cough     History reviewed. No pertinent past medical history.  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 No pertinent surgical history.     No family history on file.  Social History   Tobacco Use   Smoking status: Every Day    Pack years: 0.00   Smokeless tobacco: Current  Substance Use Topics   Alcohol use: Not Currently   Drug use: Not Currently    Home Medications Prior to Admission medications   Medication Sig Start Date End Date Taking? Authorizing Provider  albuterol (VENTOLIN HFA) 108 (90 Base) MCG/ACT inhaler Inhale 2 puffs into the lungs every 6 (six) hours as needed for wheezing or shortness of breath. 07/01/20  Yes Antony Madura, PA-C    Allergies    Codeine and Penicillins  Review of Systems   Review of Systems  Respiratory:  Positive for cough.   Ten systems reviewed and are negative for acute change, except as noted in the HPI.    Physical Exam Updated Vital Signs BP 115/86 (BP Location: Left Arm)   Pulse 93   Temp 98 F (36.7 C) (Oral)   Resp 20   SpO2 97%   Physical Exam Vitals and nursing note reviewed.  Constitutional:      General: He is  not in acute distress.    Appearance: He is well-developed. He is not diaphoretic.     Comments: Nontoxic appearing and in NAD  HENT:     Head: Normocephalic and atraumatic.  Eyes:     General: No scleral icterus.    Conjunctiva/sclera: Conjunctivae normal.  Cardiovascular:     Rate and Rhythm: Normal rate and regular rhythm.     Pulses: Normal pulses.  Pulmonary:     Effort: Pulmonary effort is normal. No respiratory distress.     Breath sounds: No stridor. No rhonchi or rales.     Comments: Faint adventitious breath sounds consistent with smoking history. Lungs otherwise CTAB. Musculoskeletal:        General: Normal range of motion.     Cervical back: Normal range of motion.  Skin:    General: Skin is warm and dry.     Coloration: Skin is not pale.     Findings: No erythema or rash.  Neurological:     Mental Status: He is alert and oriented to person, place, and time.     Coordination: Coordination normal.  Psychiatric:        Behavior: Behavior normal.    ED Results / Procedures / Treatments   Labs (all labs ordered are listed, but only abnormal results are displayed) Labs Reviewed - No  data to display  EKG None  Radiology DG Chest 2 View  Result Date: 07/01/2020 CLINICAL DATA:  45 year old male with cough.  Smoker. EXAM: CHEST - 2 VIEW COMPARISON:  None. FINDINGS: Normal cardiac size and mediastinal contours. Visualized tracheal air column is within normal limits. Lungs appear mildly hyperinflated. Symmetric coarse pulmonary interstitial opacity bilaterally. No pneumothorax, pulmonary edema, pleural effusion or confluent opacity. But there is evidence of bullous emphysema in the lingula. No acute osseous abnormality identified. Negative visible bowel gas pattern. 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 Coarse bilateral pulmonary interstitial opacity, likely smoking related. Viral/atypical respiratory infection felt less  likely. Electronically Signed   By: Odessa Fleming M.D.   On: 07/01/2020 05:57    Procedures Procedures   Medications Ordered in ED Medications  albuterol (VENTOLIN HFA) 108 (90 Base) MCG/ACT inhaler 2 puff (2 puffs Inhalation Given 07/01/20 0523)  dexamethasone (DECADRON) tablet 10 mg (10 mg Oral Given 07/01/20 0263)    ED Course  I have reviewed the triage vital signs and the nursing notes.  Pertinent labs & imaging results that were available during my care of the patient were reviewed by me and considered in my medical decision making (see chart for details).  Clinical Course as of 07/01/20 7858  Sat Jul 01, 2020  0606 Chest x-ray consistent with smoking history.  Not felt to represent acute infection, pneumonia.  On repeat assessment, patient states that his symptoms have improved with the albuterol inhaler.  No signs of respiratory distress.  Appropriate for discharge. [KH]    Clinical Course User Index [KH] Antony Madura, PA-C   MDM Rules/Calculators/A&P                          45 year old male with reported history of asthma presents for cough and shortness of breath which have been recurrent in the early morning hours over the past few days.  Has had symptomatic improvement in the ED with an albuterol inhaler, PO decadron.  Ran out of his inhaler 2 months ago and did not have this at home to use for his symptoms.  His chest x-ray is consistent with emphysema secondary to ongoing tobacco use.  No acute cardiopulmonary abnormality.  His vital signs have been stable without tachypnea, hypoxia, fever; doubt infectious etiology.  Stable for discharge with inhaler Rx.  Encouraged primary care follow-up.  Return precautions discussed and provided. Patient discharged in stable condition with no unaddressed concerns.   Final Clinical Impression(s) / ED Diagnoses Final diagnoses:  Pulmonary emphysema, unspecified emphysema type (HCC)    Rx / DC Orders ED Discharge Orders          Ordered     albuterol (VENTOLIN HFA) 108 (90 Base) MCG/ACT inhaler  Every 6 hours PRN        07/01/20 0609             Antony Madura, PA-C 07/01/20 0630    Molpus, Jonny Ruiz, MD 07/01/20 [phone]

## 2020-07-01 NOTE — Discharge Instructions (Addendum)
Your chest x-ray showed findings consistent with emphysema/COPD related to your smoking history.  We advised 2 puffs of an albuterol inhaler every 4-6 hours as needed for wheezing, shortness of breath.  Follow-up with a primary doctor.

## 2020-08-01 ENCOUNTER — Encounter (HOSPITAL_COMMUNITY): Payer: Self-pay | Admitting: Emergency Medicine

## 2020-08-01 ENCOUNTER — Other Ambulatory Visit: Payer: Self-pay

## 2020-08-01 ENCOUNTER — Ambulatory Visit (HOSPITAL_COMMUNITY)
Admission: EM | Admit: 2020-08-01 | Discharge: 2020-08-01 | Disposition: A | Discharge status: Home / Self Care | Payer: Self-pay | Attending: Student | Admitting: Student

## 2020-08-01 DIAGNOSIS — A084 Viral intestinal infection, unspecified: Secondary | ICD-10-CM

## 2020-08-01 HISTORY — DX: Unspecified asthma, uncomplicated: J45.909

## 2020-08-01 LAB — POC INFLUENZA A AND B ANTIGEN (URGENT CARE ONLY)
INFLUENZA A ANTIGEN, POC: NEGATIVE
INFLUENZA B ANTIGEN, POC: NEGATIVE

## 2020-08-01 MED ORDER — ONDANSETRON 8 MG PO TBDP: 8.0000 mg | ORAL_TABLET | Freq: Three times a day (TID) | ORAL | 0 refills | Status: DC | PRN

## 2020-08-01 MED ORDER — LOPERAMIDE HCL 2 MG PO CAPS: 2.0000 mg | ORAL_CAPSULE | Freq: Four times a day (QID) | ORAL | 0 refills | Status: DC | PRN

## 2020-08-01 NOTE — ED Provider Notes (Signed)
[facility]    CSN: [id number] Arrival date & time: 08/01/20  0805      History   Chief Complaint Chief Complaint  Patient presents with   Emesis   Fever   Generalized Body Aches    HPI Ricky Watson is a 45 y.o. male presenting with 4 days of subjective chills, nausea with vomiting, diarrhea, generalized crampy abdominal pain, generalized body aches.  Medical history depression, schizophrenia.  Endorses about 6 episodes of watery diarrhea daily, about the same of vomiting.  States he vomits whenever he eats or drinks, so he has not been eating or drinking much.  Has seen scant amount of blood on the toilet paper few times, but denies blood in stool, black stool, hematemesis. Denies recent eating out. Denies cough, congestion.  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 No pertinent surgical history.     Home Medications    Prior to Admission medications   Medication Sig Start Date End Date Taking? Authorizing Provider  loperamide (IMODIUM) 2 MG capsule Take 1 capsule (2 mg total) by mouth 4 (four) times daily as needed for diarrhea or loose stools. 08/01/20  Yes Rhys Martini, PA-C  ondansetron (ZOFRAN ODT) 8 MG disintegrating tablet Take 1 tablet (8 mg total) by mouth every 8 (eight) hours as needed for nausea or vomiting. 08/01/20  Yes Rhys Martini, PA-C  albuterol (VENTOLIN HFA) 108 (90 Base) MCG/ACT inhaler Inhale 2 puffs into the lungs every 6 (six) hours as needed for wheezing or shortness of breath. 07/01/20   Antony Madura, PA-C    Family History History reviewed. No pertinent family history.  Social History Social History   Tobacco Use   Smoking status: Every Day   Smokeless tobacco: Current  Substance Use Topics   Alcohol use:  Not Currently   Drug use: Not Currently     Allergies   Codeine and Penicillins   Review of Systems Review of Systems  Constitutional:  Positive for chills. Negative for appetite change, diaphoresis, fever and unexpected weight change.  HENT:  Negative for congestion, ear pain, sinus pressure, sinus pain, sneezing, sore throat and trouble swallowing.   Respiratory:  Negative for cough, chest tightness and shortness of breath.   Cardiovascular:  Negative for chest pain.  Gastrointestinal:  Positive for abdominal pain, diarrhea, nausea and vomiting. Negative for abdominal distention, anal bleeding, blood in stool, constipation and rectal pain.  Genitourinary:  Negative for dysuria, flank pain, frequency and urgency.  Musculoskeletal:  Positive for myalgias. Negative for back pain.  Neurological:  Negative for dizziness, light-headedness and headaches.  All other systems reviewed and are negative.   Physical Exam Triage Vital Signs ED Triage Vitals  Enc Vitals Group     BP 08/01/20 0827 (!) 149/102     Pulse Rate 08/01/20 0827 (!) 108     Resp 08/01/20 0827 17     Temp 08/01/20 0827 98.4 F (36.9 C)     Temp Source 08/01/20 0827 Oral     SpO2 08/01/20 0827 98 %     Weight --      Height --      Head Circumference --      Peak Flow --      Pain Score 08/01/20  0825 5     Pain Loc --      Pain Edu? --      Excl. in GC? --    No data found.  Updated Vital Signs BP (!) 149/102 (BP Location: Left Arm)   Pulse (!) 108   Temp 98.4 F (36.9 C) (Oral)   Resp 17   SpO2 98%   Visual Acuity Right Eye Distance:   Left Eye Distance:   Bilateral Distance:    Right Eye Near:   Left Eye Near:    Bilateral Near:     Physical Exam Vitals reviewed.  Constitutional:      General: He is not in acute distress.    Appearance: Normal appearance. He is not ill-appearing.  HENT:     Head: Normocephalic and atraumatic.     Mouth/Throat:     Mouth: Mucous membranes are moist.   Eyes:     Extraocular Movements: Extraocular movements intact.     Pupils: Pupils are equal, round, and reactive to light.  Cardiovascular:     Rate and Rhythm: Regular rhythm. Tachycardia present.     Heart sounds: Normal heart sounds.  Pulmonary:     Effort: Pulmonary effort is normal.     Breath sounds: Normal breath sounds. No wheezing, rhonchi or rales.  Abdominal:     General: Bowel sounds are increased. There is no distension.     Palpations: Abdomen is soft. There is no mass.     Tenderness: There is generalized abdominal tenderness. There is no right CVA tenderness, left CVA tenderness, guarding or rebound. Negative signs include Murphy's sign, Rovsing's sign and McBurney's sign.  Skin:    General: Skin is warm.     Capillary Refill: Capillary refill takes less than 2 seconds.  Neurological:     General: No focal deficit present.     Mental Status: He is alert and oriented to person, place, and time.  Psychiatric:        Mood and Affect: Mood normal.        Behavior: Behavior normal.     UC Treatments / Results  Labs (all labs ordered are listed, but only abnormal results are displayed) Labs Reviewed  POC INFLUENZA A AND B ANTIGEN (URGENT CARE ONLY)    EKG   Radiology No results found.  Procedures Procedures (including critical care time)  Medications Ordered in UC Medications - No data to display  Initial Impression / Assessment and Plan / UC Course  I have reviewed the triage vital signs and the nursing notes.  Pertinent labs & imaging results that were available during my care of the patient were reviewed by me and considered in my medical decision making (see chart for details).     This patient is a very pleasant 46 y.o. year old male presenting with viral gastroenteritis. Ddx includes norovirus.    He is tachycardic but BP normal range, afebrile. Negative home covid test, declines additional testing Negative rapid influenza.  Zofran, imodium,  good hydration, BRAT diet. He declines Zofran IM today.  Work note provided. Strict ED return precautions discussed. Patient verbalizes understanding and agreement.   Final Clinical Impressions(s) / UC Diagnoses   Final diagnoses:  Viral gastroenteritis     Discharge Instructions      -Take the Zofran (ondansetron) up to 3 times daily for nausea and vomiting. Dissolve one pill under your tongue or between your teeth and your cheek. -Take the Imodium (loperamide) up to 4 times daily  for diarrhea. -Drink plenty of fluids and eat a bland diet as tolerated -You will still be contagious for 1 day after your symptoms resolved.  This means if you feel great tomorrow morning, you will still be contagious until the next day.     ED Prescriptions     Medication Sig Dispense Auth. Provider   ondansetron (ZOFRAN ODT) 8 MG disintegrating tablet Take 1 tablet (8 mg total) by mouth every 8 (eight) hours as needed for nausea or vomiting. 20 tablet Rhys Martini, PA-C   loperamide (IMODIUM) 2 MG capsule Take 1 capsule (2 mg total) by mouth 4 (four) times daily as needed for diarrhea or loose stools. 12 capsule Rhys Martini, PA-C      PDMP not reviewed this encounter.   Rhys Martini, PA-C 08/01/20 [phone]

## 2020-08-01 NOTE — ED Triage Notes (Signed)
Pt presents with N,V,D, fever and body aches xs 4 days. States tested with at home COVID test with negative result.

## 2020-08-01 NOTE — Discharge Instructions (Addendum)
-  Take the Zofran (ondansetron) up to 3 times daily for nausea and vomiting. Dissolve one pill under your tongue or between your teeth and your cheek. -Take the Imodium (loperamide) up to 4 times daily for diarrhea. -Drink plenty of fluids and eat a bland diet as tolerated -You will still be contagious for 1 day after your symptoms resolved.  This means if you feel great tomorrow morning, you will still be contagious until the next day.

## 2020-08-23 ENCOUNTER — Encounter (HOSPITAL_COMMUNITY): Payer: Self-pay | Admitting: Emergency Medicine

## 2020-08-23 ENCOUNTER — Emergency Department (HOSPITAL_COMMUNITY)
Admission: EM | Admit: 2020-08-23 | Discharge: 2020-08-24 | Disposition: A | Discharge status: Other Acute Inpatient Hospital | Payer: Federal, State, Local not specified - Other | Attending: Emergency Medicine | Admitting: Emergency Medicine

## 2020-08-23 ENCOUNTER — Ambulatory Visit (HOSPITAL_COMMUNITY)
Admission: EM | Admit: 2020-08-23 | Discharge: 2020-08-23 | Disposition: A | Discharge status: Other Acute Inpatient Hospital | Payer: No Payment, Other | Attending: Registered Nurse | Admitting: Registered Nurse

## 2020-08-23 ENCOUNTER — Other Ambulatory Visit: Payer: Self-pay

## 2020-08-23 ENCOUNTER — Encounter (HOSPITAL_COMMUNITY): Payer: Self-pay | Admitting: Registered Nurse

## 2020-08-23 DIAGNOSIS — Z79899 Other long term (current) drug therapy: Secondary | ICD-10-CM | POA: Insufficient documentation

## 2020-08-23 DIAGNOSIS — Z7289 Other problems related to lifestyle: Secondary | ICD-10-CM

## 2020-08-23 DIAGNOSIS — F1024 Alcohol dependence with alcohol-induced mood disorder: Secondary | ICD-10-CM | POA: Insufficient documentation

## 2020-08-23 DIAGNOSIS — F333 Major depressive disorder, recurrent, severe with psychotic symptoms: Secondary | ICD-10-CM | POA: Diagnosis present

## 2020-08-23 DIAGNOSIS — F1021 Alcohol dependence, in remission: Secondary | ICD-10-CM

## 2020-08-23 DIAGNOSIS — F17200 Nicotine dependence, unspecified, uncomplicated: Secondary | ICD-10-CM | POA: Insufficient documentation

## 2020-08-23 DIAGNOSIS — R45851 Suicidal ideations: Secondary | ICD-10-CM | POA: Insufficient documentation

## 2020-08-23 DIAGNOSIS — F209 Schizophrenia, unspecified: Secondary | ICD-10-CM | POA: Diagnosis present

## 2020-08-23 DIAGNOSIS — F1094 Alcohol use, unspecified with alcohol-induced mood disorder: Secondary | ICD-10-CM | POA: Diagnosis present

## 2020-08-23 DIAGNOSIS — F32A Depression, unspecified: Secondary | ICD-10-CM | POA: Insufficient documentation

## 2020-08-23 DIAGNOSIS — Y901 Blood alcohol level of 20-39 mg/100 ml: Secondary | ICD-10-CM | POA: Insufficient documentation

## 2020-08-23 DIAGNOSIS — Z87898 Personal history of other specified conditions: Secondary | ICD-10-CM

## 2020-08-23 DIAGNOSIS — Z5900 Homelessness unspecified: Secondary | ICD-10-CM | POA: Insufficient documentation

## 2020-08-23 DIAGNOSIS — Z20822 Contact with and (suspected) exposure to covid-19: Secondary | ICD-10-CM | POA: Insufficient documentation

## 2020-08-23 DIAGNOSIS — J45909 Unspecified asthma, uncomplicated: Secondary | ICD-10-CM | POA: Insufficient documentation

## 2020-08-23 DIAGNOSIS — F259 Schizoaffective disorder, unspecified: Secondary | ICD-10-CM | POA: Insufficient documentation

## 2020-08-23 DIAGNOSIS — R440 Auditory hallucinations: Secondary | ICD-10-CM

## 2020-08-23 DIAGNOSIS — F1991 Other psychoactive substance use, unspecified, in remission: Secondary | ICD-10-CM

## 2020-08-23 DIAGNOSIS — Z789 Other specified health status: Secondary | ICD-10-CM

## 2020-08-23 DIAGNOSIS — F1099 Alcohol use, unspecified with unspecified alcohol-induced disorder: Secondary | ICD-10-CM | POA: Insufficient documentation

## 2020-08-23 LAB — CBC WITH DIFFERENTIAL/PLATELET
Abs Immature Granulocytes: 0.05 10*3/uL (ref 0.00–0.07)
Basophils Absolute: 0.1 10*3/uL (ref 0.0–0.1)
Basophils Relative: 1 %
Eosinophils Absolute: 0.2 10*3/uL (ref 0.0–0.5)
Eosinophils Relative: 3 %
HCT: 46.5 % (ref 39.0–52.0)
Hemoglobin: 15.9 g/dL (ref 13.0–17.0)
Immature Granulocytes: 1 %
Lymphocytes Relative: 18 %
Lymphs Abs: 1.1 10*3/uL (ref 0.7–4.0)
MCH: 31.2 pg (ref 26.0–34.0)
MCHC: 34.2 g/dL (ref 30.0–36.0)
MCV: 91.2 fL (ref 80.0–100.0)
Monocytes Absolute: 0.8 10*3/uL (ref 0.1–1.0)
Monocytes Relative: 12 %
Neutro Abs: 4 10*3/uL (ref 1.7–7.7)
Neutrophils Relative %: 65 %
Platelets: 294 10*3/uL (ref 150–400)
RBC: 5.1 MIL/uL (ref 4.22–5.81)
RDW: 13.7 % (ref 11.5–15.5)
WBC: 6.2 10*3/uL (ref 4.0–10.5)
nRBC: 0 % (ref 0.0–0.2)

## 2020-08-23 LAB — RESP PANEL BY RT-PCR (FLU A&B, COVID) ARPGX2
Influenza A by PCR: NEGATIVE
Influenza B by PCR: NEGATIVE
SARS Coronavirus 2 by RT PCR: NEGATIVE

## 2020-08-23 LAB — RAPID URINE DRUG SCREEN, HOSP PERFORMED
Amphetamines: NOT DETECTED
Barbiturates: NOT DETECTED
Benzodiazepines: NOT DETECTED
Cocaine: NOT DETECTED
Opiates: NOT DETECTED
Tetrahydrocannabinol: NOT DETECTED

## 2020-08-23 LAB — COMPREHENSIVE METABOLIC PANEL
ALT: 34 U/L (ref 0–44)
AST: 38 U/L (ref 15–41)
Albumin: 3.9 g/dL (ref 3.5–5.0)
Alkaline Phosphatase: 82 U/L (ref 38–126)
Anion gap: 8 (ref 5–15)
BUN: 8 mg/dL (ref 6–20)
CO2: 26 mmol/L (ref 22–32)
Calcium: 8.5 mg/dL — ABNORMAL LOW (ref 8.9–10.3)
Chloride: 105 mmol/L (ref 98–111)
Creatinine, Ser: 0.69 mg/dL (ref 0.61–1.24)
GFR, Estimated: >60 mL/min (ref >60)
Glucose, Bld: 99 mg/dL (ref 70–99)
Potassium: 3.8 mmol/L (ref 3.5–5.1)
Sodium: 139 mmol/L (ref 135–145)
Total Bilirubin: 0.5 mg/dL (ref 0.3–1.2)
Total Protein: 7.2 g/dL (ref 6.5–8.1)

## 2020-08-23 LAB — ETHANOL: Alcohol, Ethyl (B): 33 mg/dL — ABNORMAL HIGH (ref <10)

## 2020-08-23 MED ORDER — ALBUTEROL SULFATE HFA 108 (90 BASE) MCG/ACT IN AERS: 2.0000 | INHALATION_SPRAY | Freq: Four times a day (QID) | RESPIRATORY_TRACT | Status: DC | PRN

## 2020-08-23 NOTE — BH Assessment (Addendum)
Comprehensive Clinical Assessment (CCA) Note  08/23/2020 Ricky Watson [id number]  Disposition: Disposition pending provider evaluation.   The patient demonstrates the following risk factors for suicide: Chronic risk factors for suicide include: psychiatric disorder of Schizoaffective Disorder and previous suicide attempts mult. Most recent by cutting neck 1 yr ago with inpt tx in [place] . Acute risk factors for suicide include: unemployment, social withdrawal/isolation, and loss (financial, interpersonal, professional). Protective factors for this patient include: responsibility to others (children, family) and hope for the future. Considering these factors, the overall suicide risk at this point appears to be moderate. Patient is appropriate for outpatient follow up once stabilized.   Patient is a 45 year old male/male with a history of @,@,@ who presents voluntarily/involuntarily to [facility] for assessment.  Patient called 911 today reporting command AH and SI.  He has been off meds for 1 month(felt better and thought he didn't need them), got into argument at [place], got kicked out and is now homeless.  Voices telling him to harm himself and he has a hx of significant attempts. Patient states he has past attempts via cutting and threatening to shoot self, most recent attempt was by cutting his neck (superficial) and was admitted to inpt program in [place](name unknown?).  He denies HI.  He admits to drinking a small bottle of wine today and had a small bottle with him, that he threw out in the [place] trash. Patient is requesting inpatient admission to get back on medications, stating "it was clearly a mistake to stop taking them."     Chief Complaint: No chief complaint on file.  Visit Diagnosis: Schizoaffective Disorder                             Hx SUD - currently clean/sober   Flowsheet Row ED from 08/23/2020 in [facility] ED  from 08/01/2020 in [facility] ED from 07/01/2020 in [facility]  C-SSRS RISK CATEGORY High Risk Error: Question 6 not populated No Risk       CCA Screening, Triage and Referral (STR)  Patient Reported Information How did you hear about [place]? No data recorded What Is the Reason for Your Visit/Call Today? Patient called 911 today reporting command AH and SI.  He has been off meds for 1 month(felt better and thought he didn't need them), got into argument at [place], got kicked out and is now homeless.  Voices telling him to harm himself and he has a hx of significant attempts.  He denies HI.  He admits to drinking a small bottle of wine today and had a small bottle with him, that he threw out in the [place] trash.  How Long Has This Been Causing You Problems? <Week  What Do You Feel Would Help You the Most Today? Treatment for Depression or other mood problem   Have You Recently Had Any Thoughts About Hurting Yourself? Yes  Are You Planning to Commit Suicide/Harm Yourself At This time? No   Have you Recently Had Thoughts About Hurting Someone Karolee Ohs? No  Are You Planning to Harm Someone at This Time? No  Explanation: No data recorded  Have You Used Any Alcohol or Drugs in the Past 24 Hours? Yes  How Long Ago Did You Use Drugs or Alcohol? No data recorded What Did You Use and How Much? small bottle of wine today  Do You Currently Have a Therapist/Psychiatrist? No  Name of Therapist/Psychiatrist: No data recorded  Have You Been Recently Discharged From Any Office Practice or Programs? No  Explanation of Discharge From Practice/Program: No data recorded    CCA Screening Triage Referral Assessment Type of Contact: Face-to-Face  Telemedicine Service Delivery:   Is this Initial or Reassessment? No data recorded Date Telepsych consult ordered in [facility]:  No data recorded Time Telepsych consult ordered in [facility]:  No data  recorded [facility]  [facility]   Collateral Involvement: N/A   Does Patient Have a Automotive engineerCourt Appointed Legal Guardian? No data recorded Name and Contact of Legal Guardian: No data recorded If Minor and Not Living with Parent(s), Who has Custody? No data recorded Is CPS involved or ever been involved? Never  Is APS involved or ever been involved? Never   Patient Determined To Be At Risk for Harm To Self or Others Based on Review of Patient Reported Information or Presenting Complaint? Yes, for Self-Harm  Method: No data recorded Availability of Means: No data recorded Intent: No data recorded Notification Required: No data recorded Additional Information for Danger to Others Potential: No data recorded Additional Comments for Danger to Others Potential: No data recorded Are There Guns or Other Weapons in Your Home? No data recorded Types of Guns/Weapons: No data recorded Are These Weapons Safely Secured?                            No data recorded Who Could Verify You Are Able To Have These Secured: No data recorded Do You Have any Outstanding Charges, Pending Court Dates, Parole/Probation? No data recorded Contacted To Inform of Risk of Harm To Self or Others: Other: Comment [facility] staff aware of SI)    Does Patient Present under Involuntary Commitment? No  IVC Papers Initial File Date: No data recorded  [place]   Patient Currently Receiving the Following Services: Not Receiving Services   Determination of Need: Urgent (48 hours)   Options For Referral: Inpatient Hospitalization; Intensive Outpatient Therapy; Medication Management; Partial Hospitalization     CCA Biopsychosocial Patient Reported Schizophrenia/Schizoaffective Diagnosis in Past: Yes   Strengths: Resourceful, motivated towards treatment   Mental Health Symptoms Depression:   Change in energy/activity;  Difficulty Concentrating; Irritability; Sleep (too much or little); Weight gain/loss   Duration of Depressive symptoms:  Duration of Depressive Symptoms: Less than two weeks   Mania:   None   Anxiety:    Worrying; Tension   Psychosis:   Hallucinations   Duration of Psychotic symptoms:  Duration of Psychotic Symptoms: Greater than six months (chronic AH, sx improve with medication - off meds 1 month)   Trauma:   None   Obsessions:   None   Compulsions:   None   Inattention:   N/A   Hyperactivity/Impulsivity:   N/A   Oppositional/Defiant Behaviors:   N/A   Emotional Irregularity:   Potentially harmful impulsivity; Mood lability; Chronic feelings of emptiness   Other Mood/Personality Symptoms:  No data recorded   Mental Status Exam Appearance and self-care  Stature:   Average   Weight:   Average weight   Clothing:   Casual   Grooming:   Normal   Cosmetic use:   None   Posture/gait:   Normal   Motor activity:   Not Remarkable   Sensorium  Attention:   Normal  Concentration:   Normal   Orientation:   X5   Recall/memory:   Normal   Affect and Mood  Affect:  No data recorded  Mood:   Depressed   Relating  Eye contact:   Normal   Facial expression:   Depressed   Attitude toward examiner:   Cooperative   Thought and Language  Speech flow:  Clear and Coherent   Thought content:   Appropriate to Mood and Circumstances   Preoccupation:   None   Hallucinations:   Auditory; Command (Comment) (command AH telling him to kill himself)   Organization:  No data recorded  [employer] of Knowledge:   Fair   Intelligence:   Average   Abstraction:   Normal   Judgement:   Fair   Dance movement psychotherapist:   Adequate   Insight:   Flashes of insight   Decision Making:   Vacilates; Impulsive   Social Functioning  Social Maturity:   Responsible   Social Judgement:   Normal   Stress  Stressors:   Housing;  Relationship   Coping Ability:   Exhausted; Overwhelmed   Skill Deficits:   Communication; Interpersonal; Self-control   Supports:   Friends/Service system     Religion: Religion/Spirituality Are You A Religious Person?: No  Leisure/Recreation: Leisure / Recreation Do You Have Hobbies?: No  Exercise/Diet: Exercise/Diet Do You Exercise?: No Have You Gained or Lost A Significant Amount of Weight in the Past Six Months?: No Do You Follow a Special Diet?: No Do You Have Any Trouble Sleeping?: Yes Explanation of Sleeping Difficulties: difficulty sleeping since being kicked out of [employer]   CCA Employment/Education Employment/Work Situation: Employment / Work Situation Employment Situation: Unemployed Patient's Job has Been Impacted by Current Illness: Yes Describe how Patient's Job has Been Impacted: lost job after getting into argument with [place] roommate, as pt no longer had a ride to work Has Patient ever Been in Equities trader?: No  Education: Education Is Patient Currently Attending School?: No Last Grade Completed: 12 Did You Product manager?: No Did You Have An Individualized Education Program (IIEP): No Did You Have Any Difficulty At [employer]?: No Patient's Education Has Been Impacted by Current Illness: No   CCA Family/Childhood History Family and Relationship History: Family history Marital status: Single Does patient have children?: Yes How many children?: 11 How is patient's relationship with their children?: children with 5 different mothers - all live in [place] - keeps in contact with kids via phone  Childhood History:  Childhood History By whom was/is the patient raised?: Both parents Did patient suffer any verbal/emotional/physical/sexual abuse as a child?: No Did patient suffer from severe childhood neglect?: No Has patient ever been sexually abused/assaulted/raped as an adolescent or adult?: No Was the patient ever a victim of a crime or a  disaster?: No Witnessed domestic violence?: No Has patient been affected by domestic violence as an adult?: No  Child/Adolescent Assessment:     CCA Substance Use Alcohol/Drug Use: Alcohol / Drug Use Pain Medications: see MAR Prescriptions: see MAR Over the Counter: see MAR History of alcohol / drug use?: Yes (Drank 1 small bottle of wine today - sober 9 mos prior to today - 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.) Longest period of sobriety (when/how long): 2 years Negative Consequences of Use: Personal relationships Withdrawal Symptoms: Patient aware of relationship between substance abuse and physical/medical complications     ASAM's:  Six Dimensions of Multidimensional Assessment  Dimension  1:  Acute Intoxication and/or Withdrawal Potential:      Dimension 2:  Biomedical Conditions and Complications:      Dimension 3:  Emotional, Behavioral, or Cognitive Conditions and Complications:     Dimension 4:  Readiness to Change:     Dimension 5:  Relapse, Continued use, or Continued Problem Potential:     Dimension 6:  Recovery/Living Environment:     ASAM Severity Score:    ASAM Recommended Level of Treatment:     Substance use Disorder (SUD)    Recommendations for Services/Supports/Treatments:    Discharge Disposition:    DSM5 Diagnoses: Patient Active Problem List   Diagnosis Date Noted   Schizophrenia (HCC) 04/10/2019   Major depressive disorder, recurrent, 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

## 2020-08-23 NOTE — ED Notes (Signed)
Pt ambulatory to bathroom for urine sample.

## 2020-08-23 NOTE — ED Triage Notes (Signed)
Pt arrived from [facility]. Pt states he is suicidal and has a plan to hurt himself.

## 2020-08-23 NOTE — Progress Notes (Signed)
   08/23/20 1732  [facility] Triage Screening (Walk-ins at [facility] only)  What Is the Reason for Your Visit/Call Today? Patient called 911 today reporting command AH and SI.  He has been off meds for 1 month(felt better and thought he didn't need them), got into argument at [place], got kicked out and is now homeless.  Voices telling him to harm himself and he has a hx of significant attempts.  He denies HI.  He admits to drinking a small bottle of wine today and had a small bottle with him, that he threw out in the [place] trash.  How Long Has This Been Causing You Problems? <Week  Have You Recently Had Any Thoughts About Hurting Yourself? Yes  How long ago did you have thoughts about hurting yourself? AH telling him to kill himself - SI, no plan - voices are loud and intrusive  Are You Planning to Commit Suicide/Harm Yourself At This time? No  Have you Recently Had Thoughts About Hurting Someone Karolee Ohs? No  Are You Planning To Harm Someone At This Time? No  Are you currently experiencing any auditory, visual or other hallucinations? Yes  Please explain the hallucinations you are currently experiencing: loud, negative, command AH telling him to kill himself  Have You Used Any Alcohol or Drugs in the Past 24 Hours? Yes  How long ago did you use Drugs or Alcohol? today  What Did You Use and How Much? small bottle of wine today  Do you have any current medical co-morbidities that require immediate attention? No  Clinician description of patient physical appearance/behavior: Patient is calm, cooperative, flat affect, requesting "help"  What Do You Feel Would Help You the Most Today? Treatment for Depression or other mood problem  If access to [facility] was not available, would you have sought care in the Emergency Department? Yes  Determination of Need Urgent (48 hours)  Options For Referral Inpatient Hospitalization;Intensive Outpatient Therapy;Medication Management;Partial Hospitalization
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
## 2020-08-23 NOTE — ED Notes (Signed)
Pt belongings have been transferred to Charge nursing station cabinet label 16-18. Pt has 3 belonging bags.

## 2020-08-23 NOTE — ED Provider Notes (Signed)
[facility]-EMERGENCY DEPT Provider Note   CSN: [id number] Arrival date & time: 08/23/20  1947     History Chief Complaint  Patient presents with   Suicidal    Ricky Watson is a 45 y.o. male.  HPI  45 year old male with history of asthma, alcohol induced mood disorder, schizophrenia presenting to the emergency department with relapse on alcohol and command auditory hallucinations in the setting of medication noncompliance.  Patient was initially seen in the behavioral health urgent care and was transferred to the [facility] emergency department for further evaluation.  He states that he has had persistent auditory hallucinations that have been telling him to harm himself.  He states that he stopped taking his psychiatric medications months ago because he did not think that they were working.  He presents voluntarily for evaluation.  He persistently endorses SI without a plan and auditory hallucinations.  He does have prior suicide attempts via cutting.  He admits to drinking a single beer today.  He denies any HI.  He denies a history of alcohol withdrawal.  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 No pertinent surgical history.     No family history on file.  Social History   Tobacco Use   Smoking status: Every Day   Smokeless tobacco: Current  Substance Use Topics   Alcohol use: Not Currently   Drug use: Not Currently    Home Medications Prior to Admission medications   Medication Sig Start Date End Date Taking? Authorizing Provider  albuterol (VENTOLIN HFA) 108  (90 Base) MCG/ACT inhaler Inhale 2 puffs into the lungs every 6 (six) hours as needed for wheezing or shortness of breath. 07/01/20  Yes Antony Madura, PA-C  Aspirin-Acetaminophen-Caffeine (GOODY HEADACHE PO) Take 1-3 packets by mouth daily as needed (for headaches).   Yes [provider]  BUSPIRONE HCL PO Take 1 tablet by mouth in the morning.    [provider]  loperamide (IMODIUM) 2 MG capsule Take 1 capsule (2 mg total) by mouth 4 (four) times daily as needed for diarrhea or loose stools. Patient not taking: Reported on 08/23/2020 08/01/20   Rhys Martini, PA-C  ondansetron [facility] ODT) 8 MG disintegrating tablet Take 1 tablet (8 mg total) by mouth every 8 (eight) hours as needed for nausea or vomiting. Patient not taking: Reported on 08/23/2020 08/01/20   Rhys Martini, PA-C  QUETIAPINE FUMARATE PO Take 1 tablet by mouth at bedtime.    [provider]  RISPERIDONE PO Take 1 tablet by mouth in the morning and at bedtime.    [provider]    Allergies    Codeine, Diphenhydramine hcl, Diphenhydramine-zinc acetate, Penicillins, and Zolpidem  Review of Systems   Review of Systems  Constitutional:  Negative for chills and fever.  HENT:  Negative for ear pain and sore throat.   Eyes:  Negative for pain and visual disturbance.  Respiratory:  Negative for cough and shortness of breath.   Cardiovascular:  Negative for chest pain and palpitations.  Gastrointestinal:  Negative for abdominal pain and vomiting.  Genitourinary:  Negative for dysuria and  hematuria.  Musculoskeletal:  Negative for arthralgias and back pain.  Skin:  Negative for color change and rash.  Neurological:  Negative for seizures and syncope.  Psychiatric/Behavioral:  Positive for hallucinations and suicidal ideas.   All other systems reviewed and are negative.  Physical Exam Updated Vital Signs BP (!) 154/99 (BP Location: Left Arm)   Pulse 86   Temp 98.8 F (37.1 C) (Oral)   Resp  16   Ht 5\' 8"  (1.727 m)   Wt 67.1 kg   SpO2 97%   BMI 22.50 kg/m   Physical Exam Vitals and nursing note reviewed.  Constitutional:      Appearance: He is well-developed.  HENT:     Head: Normocephalic and atraumatic.  Eyes:     Conjunctiva/sclera: Conjunctivae normal.  Cardiovascular:     Rate and Rhythm: Normal rate and regular rhythm.     Heart sounds: No murmur heard. Pulmonary:     Effort: Pulmonary effort is normal. No respiratory distress.     Breath sounds: Wheezing present.  Abdominal:     Palpations: Abdomen is soft.     Tenderness: There is no abdominal tenderness.  Musculoskeletal:     Cervical back: Neck supple.  Skin:    General: Skin is warm and dry.  Neurological:     Mental Status: He is alert.  Psychiatric:        Attention and Perception: He perceives auditory hallucinations.        Speech: Speech normal.        Behavior: Behavior normal. Behavior is cooperative.        Thought Content: Thought content includes suicidal ideation. Thought content does not include suicidal plan.    ED Results / Procedures / Treatments   Labs (all labs ordered are listed, but only abnormal results are displayed) Labs Reviewed  COMPREHENSIVE METABOLIC PANEL - Abnormal; Notable for the following components:      Result Value   Calcium 8.5 (*)    All other components within normal limits  ETHANOL - Abnormal; Notable for the following components:   Alcohol, Ethyl (B) 33 (*)    All other components within normal limits  RESP PANEL BY RT-PCR (FLU A&B, COVID) ARPGX2  RAPID URINE DRUG SCREEN, HOSP PERFORMED  CBC WITH DIFFERENTIAL/PLATELET    EKG None  Radiology No results found.  Procedures Procedures   Medications Ordered in ED Medications  albuterol (VENTOLIN HFA) 108 (90 Base) MCG/ACT inhaler 2 puff (has no administration in time range)    ED Course  I have reviewed the triage vital signs and the nursing notes.  Pertinent labs & imaging results that  were available during my care of the patient were reviewed by me and considered in my medical decision making (see chart for details).    MDM Rules/Calculators/A&P                           45 year old male presenting to the emergency department with concern for decompensated schizophrenia with command auditory hallucinations and suicidal ideation. He arrived afebrile, mildly hypertensive BP 154/99, stable, saturating well on room air.  Some concern for alcohol intoxication at the behavioral health urgent care on arrival of the patient does not appear intoxicated.  Initial ethanol level relatively low at 33.  The patient was deemed to be clinically sober in the emergency department.  UDS was performed and resulted negative for opiates cocaine, benzodiazepines, amphetamines, and THC or barbiturates.  Patient's COVID-19 and influenza PCR was collected and resulted negative.  CBC and CMP were grossly unremarkable.  He currently denies any infectious symptoms.  The patient denies history of alcohol withdrawal, is not tachycardic, agitated or tremulous.  He does have a history of asthma and had mild expiratory wheezing on physical exam without evidence of respiratory distress.  His home albuterol MDI was reordered.  His home psychiatric medications were reconciled and were not reordered given his noncompliance for the past several months.  He was deemed to be medically clear and a TTS consult was placed.  Final Clinical Impression(s) / ED Diagnoses Final diagnoses:  Suicidal ideation  Auditory hallucinations  Alcohol use    Rx / DC Orders ED Discharge Orders     None        Ernie Avena, MD 08/23/20 2354

## 2020-08-23 NOTE — ED Provider Notes (Signed)
Emergency Medicine Provider Triage Evaluation Note  Koehn Salehi , a 45 y.o. male  was evaluated in triage.  Pt complains of suicidal ideations.  Previous attempts, sent here from behavioral health for medical clearance.  History of schizoaffective, has been off meds for 1 month.  Has had thoughts of hurting himself, is seeing things that are not there.  No HI.  Review of Systems  Positive: SI, visual hallucinations Negative: HI  Physical Exam  There were no vitals taken for this visit. Gen:   Awake, no distress. Depressed affect.  Resp:  Normal effort  MSK:   Moves extremities without difficulty  Other:    Medical Decision Making  Medically screening exam initiated at 7:54 PM.  Appropriate orders placed.  Ivar Drape Gene Osley was informed that the remainder of the evaluation will be completed by another provider, this initial triage assessment does not replace that evaluation, and the importance of remaining in the ED until their evaluation is complete.     Theron Arista, PA-C 08/23/20 1959    Mancel Bale, MD 08/24/20 0002

## 2020-08-24 ENCOUNTER — Inpatient Hospital Stay (HOSPITAL_COMMUNITY)
Admission: AD | Admit: 2020-08-24 | Discharge: 2020-08-26 | DRG: 885 | Disposition: A | Discharge status: Home / Self Care | Payer: Federal, State, Local not specified - Other | Source: Intra-hospital | Attending: Psychiatry | Admitting: Psychiatry

## 2020-08-24 ENCOUNTER — Encounter (HOSPITAL_COMMUNITY): Payer: Self-pay | Admitting: Registered Nurse

## 2020-08-24 DIAGNOSIS — F419 Anxiety disorder, unspecified: Secondary | ICD-10-CM | POA: Diagnosis present

## 2020-08-24 DIAGNOSIS — Z9114 Patient's other noncompliance with medication regimen: Secondary | ICD-10-CM | POA: Diagnosis not present

## 2020-08-24 DIAGNOSIS — Z880 Allergy status to penicillin: Secondary | ICD-10-CM

## 2020-08-24 DIAGNOSIS — G479 Sleep disorder, unspecified: Secondary | ICD-10-CM | POA: Diagnosis present

## 2020-08-24 DIAGNOSIS — Z818 Family history of other mental and behavioral disorders: Secondary | ICD-10-CM

## 2020-08-24 DIAGNOSIS — Z765 Malingerer [conscious simulation]: Secondary | ICD-10-CM

## 2020-08-24 DIAGNOSIS — F259 Schizoaffective disorder, unspecified: Secondary | ICD-10-CM | POA: Diagnosis present

## 2020-08-24 DIAGNOSIS — Z885 Allergy status to narcotic agent status: Secondary | ICD-10-CM

## 2020-08-24 DIAGNOSIS — R45851 Suicidal ideations: Secondary | ICD-10-CM | POA: Diagnosis present

## 2020-08-24 DIAGNOSIS — J45909 Unspecified asthma, uncomplicated: Secondary | ICD-10-CM | POA: Diagnosis present

## 2020-08-24 DIAGNOSIS — F4310 Post-traumatic stress disorder, unspecified: Secondary | ICD-10-CM | POA: Diagnosis present

## 2020-08-24 DIAGNOSIS — F17210 Nicotine dependence, cigarettes, uncomplicated: Secondary | ICD-10-CM | POA: Diagnosis present

## 2020-08-24 DIAGNOSIS — F333 Major depressive disorder, recurrent, severe with psychotic symptoms: Secondary | ICD-10-CM

## 2020-08-24 DIAGNOSIS — Z560 Unemployment, unspecified: Secondary | ICD-10-CM | POA: Diagnosis not present

## 2020-08-24 DIAGNOSIS — Z5900 Homelessness unspecified: Secondary | ICD-10-CM

## 2020-08-24 DIAGNOSIS — F1020 Alcohol dependence, uncomplicated: Principal | ICD-10-CM | POA: Diagnosis present

## 2020-08-24 DIAGNOSIS — Z9151 Personal history of suicidal behavior: Secondary | ICD-10-CM

## 2020-08-24 DIAGNOSIS — F1510 Other stimulant abuse, uncomplicated: Secondary | ICD-10-CM | POA: Diagnosis present

## 2020-08-24 DIAGNOSIS — Z79899 Other long term (current) drug therapy: Secondary | ICD-10-CM | POA: Diagnosis not present

## 2020-08-24 DIAGNOSIS — Z888 Allergy status to other drugs, medicaments and biological substances status: Secondary | ICD-10-CM

## 2020-08-24 MED ORDER — NICOTINE 21 MG/24HR TD PT24
21.0000 mg | MEDICATED_PATCH | Freq: Every day | TRANSDERMAL | Status: DC
  Filled 2020-08-24: qty 1

## 2020-08-24 MED ORDER — THIAMINE HCL 100 MG/ML IJ SOLN
100.0000 mg | Freq: Once | INTRAMUSCULAR | Status: DC
Start: 2020-08-24 — End: 2020-08-26

## 2020-08-24 MED ORDER — LORAZEPAM 1 MG PO TABS: 1.0000 mg | ORAL_TABLET | Freq: Four times a day (QID) | ORAL | Status: DC | PRN

## 2020-08-24 MED ORDER — TRAZODONE HCL 50 MG PO TABS
50.0000 mg | ORAL_TABLET | Freq: Every evening | ORAL | Status: DC | PRN
  Administered 2020-08-25: 50 mg via ORAL
  Filled 2020-08-24: qty 1
  Filled 2020-08-24: qty 7

## 2020-08-24 MED ORDER — THIAMINE HCL 100 MG PO TABS
100.0000 mg | ORAL_TABLET | Freq: Every day | ORAL | Status: DC
  Administered 2020-08-24: 100 mg via ORAL
  Filled 2020-08-24 (×6): qty 1

## 2020-08-24 MED ORDER — ACETAMINOPHEN 325 MG PO TABS
650.0000 mg | ORAL_TABLET | Freq: Four times a day (QID) | ORAL | Status: DC | PRN
  Administered 2020-08-24 – 2020-08-26 (×3): 650 mg via ORAL
  Filled 2020-08-24 (×3): qty 2

## 2020-08-24 MED ORDER — HYDROXYZINE HCL 25 MG PO TABS
25.0000 mg | ORAL_TABLET | Freq: Three times a day (TID) | ORAL | Status: DC | PRN
Start: 2020-08-24 — End: 2020-08-26
  Administered 2020-08-24 – 2020-08-26 (×4): 25 mg via ORAL
  Filled 2020-08-24: qty 10
  Filled 2020-08-24 (×4): qty 1

## 2020-08-24 MED ORDER — PANTOPRAZOLE SODIUM 40 MG PO TBEC
40.0000 mg | DELAYED_RELEASE_TABLET | Freq: Every day | ORAL | Status: DC
  Administered 2020-08-24: 40 mg via ORAL
  Filled 2020-08-24: qty 1
  Filled 2020-08-24: qty 7
  Filled 2020-08-24 (×4): qty 1
  Filled 2020-08-24: qty 7

## 2020-08-24 MED ORDER — ALUM & MAG HYDROXIDE-SIMETH 200-200-20 MG/5ML PO SUSP: 30.0000 mL | ORAL | Status: DC | PRN

## 2020-08-24 MED ORDER — FOLIC ACID 1 MG PO TABS
1.0000 mg | ORAL_TABLET | Freq: Every day | ORAL | Status: DC
  Administered 2020-08-24: 1 mg via ORAL
  Filled 2020-08-24 (×6): qty 1

## 2020-08-24 MED ORDER — MAGNESIUM HYDROXIDE 400 MG/5ML PO SUSP: 30.0000 mL | Freq: Every day | ORAL | Status: DC | PRN

## 2020-08-24 MED ORDER — NICOTINE 21 MG/24HR TD PT24
21.0000 mg | MEDICATED_PATCH | Freq: Every day | TRANSDERMAL | Status: DC
  Administered 2020-08-25 – 2020-08-26 (×2): 21 mg via TRANSDERMAL
  Filled 2020-08-24 (×4): qty 1

## 2020-08-24 MED ORDER — ALBUTEROL SULFATE HFA 108 (90 BASE) MCG/ACT IN AERS: 2.0000 | INHALATION_SPRAY | Freq: Four times a day (QID) | RESPIRATORY_TRACT | Status: DC | PRN

## 2020-08-24 MED ORDER — OLANZAPINE 5 MG PO TBDP
5.0000 mg | ORAL_TABLET | Freq: Every day | ORAL | Status: DC
  Filled 2020-08-24: qty 1

## 2020-08-24 NOTE — Tx Team (Signed)
Initial Treatment Plan 08/24/2020 6:27 PM Ricky Watson [id number]    PATIENT STRESSORS: Financial difficulties Medication change or noncompliance Occupational concerns Substance abuse   PATIENT STRENGTHS: Ability for insight Communication skills Work skills   PATIENT IDENTIFIED PROBLEMS: Suicidal ideation  homeless  Lost job  Substance use               DISCHARGE CRITERIA:  Ability to meet basic life and health needs Adequate post-discharge living arrangements Improved stabilization in mood, thinking, and/or behavior Verbal commitment to aftercare and medication compliance  PRELIMINARY DISCHARGE PLAN: Placement in alternative living arrangements  PATIENT/FAMILY INVOLVEMENT: This treatment plan has been presented to and reviewed with the patient, Ricky Watson.   The patient has been given the opportunity to ask questions and make suggestions.  Garnette Scheuermann, RN 08/24/2020, 6:27 PM

## 2020-08-24 NOTE — BH Assessment (Signed)
B[facility] Assessment Progress Note   Per Shuvon Rankin, NP, this pt requires psychiatric hospitalization at this time.  Brook, RN, [facility] has assigned pt to [facility] Rm 407-1 to the service of Landry Mellow, MD.  [facility] will be ready to receive pt at 14:00.  Pt has signed Voluntary Admission and Consent for Treatment, as well as Consent to Release Information to no one, and signed forms have been faxed to [facility].  EDP Pieter Partridge, MD and pt's nurse, Garald Balding, have been notified, and Garald Balding agrees to send original paperwork along with pt via [employer], and to call report to [phone].  Doylene Canning, [place] [facility] Coordinator [phone]'
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
## 2020-08-24 NOTE — Consult Note (Signed)
EKG today:  QT/QTc 382/446  Zyprexa 5 mg PO Q HS ordered.  EKG ordered for 08/25/20.   Staff to monitor for safety while awaiting transfer to inpatient psychiatric bed.

## 2020-08-24 NOTE — Progress Notes (Signed)
Group was not held due to staffing.  

## 2020-08-24 NOTE — Consult Note (Signed)
[facility] Psych ED Progress Note  08/24/2020 12:20 PM Ricky Watson  MRN:  [id number]   Method of visit?: Face to Face   Subjective:  Ricky Watson is 45 y.o male seen by this provider face to face at [facility] ED. Patient is alert and oriented to person, place and situation. When asked why he is here, "I am suicidal, hearing voices, and seeing things" Eye contact is fair, lying calmly in ED and cooperating with interview. Endorses ongoing suicidal ideation with intent and plans to walk out in front of a car, denies homicidal ideation. Endorses auditory and visual hallucinations, the voices scream at me to hurt myself.  Unable to contract for safety.  Multiple past suicide attempts by shooting himself, trying to hang himself, and cutting himself. (Per patient report).   Denies substance use (UDS negative), endorses one "big" beer prior to coming to ED, has been mostly clean for a while.   Was staying at [facility], where he got into a fight with his roommate and got kicked out on Sunday, lost his job on Monday. He is now homeless.   Feels worthless and that he doesn't deserve to breathe. Affect congruent. Reports that he is from [place], has not seen psychiatrist since being in [place]. Has children in [place] that he gets to see via virtual visits.  Previous inpatient psychiatric stay in [facility] 9 months ago. Has not been compliant on home medications. Reports he is suppose to take risperidone,  Buspar, Seroquel, and trazodone (unknown doses). He cannot think of any protective factors at this time that would prevent him from hurting himself.    Principal Problem: Major depressive disorder, recurrent, severe with psychotic features (HCC) Diagnosis:  Principal Problem:   Major depressive disorder, recurrent, severe with psychotic features (HCC) Active Problems:   Schizophrenia (HCC)  Total Time spent with patient: 30 minutes  Past Psychiatric History:   Past Medical History:  Past Medical  History:  Diagnosis Date   Asthma    History reviewed. No pertinent surgical history. Family History: No family history on file. Family Psychiatric  History:  Social History:  Social History   Substance and Sexual Activity  Alcohol Use Not Currently     Social History   Substance and Sexual Activity  Drug Use Not Currently    Social History   Socioeconomic History   Marital status: Divorced    Spouse name: Not on file   Number of children: Not on file   Years of education: Not on file   Highest education level: Not on file  Occupational History   Not on file  Tobacco Use   Smoking status: Every Day   Smokeless tobacco: Current  Substance and Sexual Activity   Alcohol use: Not Currently   Drug use: Not Currently   Sexual activity: Not Currently  Other Topics Concern   Not on file  Social History Narrative   Not on file   Social Determinants of Health   Financial Resource Strain: Not on file  Food Insecurity: Not on file  Transportation Needs: Not on file  Physical Activity: Not on file  Stress: Not on file  Social Connections: Not on file    Sleep: Fair  Appetite:  Fair  Current Medications: Current Facility-Administered Medications  Medication Dose Route Frequency Provider Last Rate Last Admin   albuterol (VENTOLIN HFA) 108 (90 Base) MCG/ACT inhaler 2 puff  2 puff Inhalation Q6H PRN Ernie Avena, MD       Current  Outpatient Medications  Medication Sig Dispense Refill   albuterol (VENTOLIN HFA) 108 (90 Base) MCG/ACT inhaler Inhale 2 puffs into the lungs every 6 (six) hours as needed for wheezing or shortness of breath. 1 each 0   Aspirin-Acetaminophen-Caffeine (GOODY HEADACHE PO) Take 1-3 packets by mouth daily as needed (for headaches).     BUSPIRONE HCL PO Take 1 tablet by mouth in the morning.     loperamide (IMODIUM) 2 MG capsule Take 1 capsule (2 mg total) by mouth 4 (four) times daily as needed for diarrhea or loose stools. (Patient not taking:  Reported on 08/23/2020) 12 capsule 0   ondansetron (ZOFRAN ODT) 8 MG disintegrating tablet Take 1 tablet (8 mg total) by mouth every 8 (eight) hours as needed for nausea or vomiting. (Patient not taking: Reported on 08/23/2020) 20 tablet 0   QUETIAPINE FUMARATE PO Take 1 tablet by mouth at bedtime.     RISPERIDONE PO Take 1 tablet by mouth in the morning and at bedtime.      Lab Results:  Results for orders placed or performed during the hospital encounter of 08/23/20 (from the past 48 hour(s))  Resp Panel by RT-PCR (Flu A&B, Covid) Nasopharyngeal Swab     Status: None   Collection Time: 08/23/20  8:02 PM   Specimen: Nasopharyngeal Swab; Nasopharyngeal(NP) swabs in vial transport medium  Result Value Ref Range   SARS Coronavirus 2 by RT PCR NEGATIVE NEGATIVE    Comment: (NOTE) SARS-CoV-2 target nucleic acids are NOT DETECTED.  The SARS-CoV-2 RNA is generally detectable in upper respiratory specimens during the acute phase of infection. The lowest concentration of SARS-CoV-2 viral copies this assay can detect is 138 copies/mL. A negative result does not preclude SARS-Cov-2 infection and should not be used as the sole basis for treatment or other patient management decisions. A negative result may occur with  improper specimen collection/handling, submission of specimen other than nasopharyngeal swab, presence of viral mutation(s) within the areas targeted by this assay, and inadequate number of viral copies(<138 copies/mL). A negative result must be combined with clinical observations, patient history, and epidemiological information. The expected result is Negative.  Fact Sheet for Patients:  BloggerCourse.com  Fact Sheet for Healthcare Providers:  SeriousBroker.it  This test is no t yet approved or cleared by the [place] FDA and  has been authorized for detection and/or diagnosis of SARS-CoV-2 by FDA under an Emergency Use  Authorization (EUA). This EUA will remain  in effect (meaning this test can be used) for the duration of the COVID-19 declaration under Section 564(b)(1) of the Act, 21 U.S.C.section 360bbb-3(b)(1), unless the authorization is terminated  or revoked sooner.       Influenza A by PCR NEGATIVE NEGATIVE   Influenza B by PCR NEGATIVE NEGATIVE    Comment: (NOTE) The Xpert Xpress SARS-CoV-2/FLU/RSV plus assay is intended as an aid in the diagnosis of influenza from Nasopharyngeal swab specimens and should not be used as a sole basis for treatment. Nasal washings and aspirates are unacceptable for Xpert Xpress SARS-CoV-2/FLU/RSV testing.  Fact Sheet for Patients: BloggerCourse.com  Fact Sheet for Healthcare Providers: SeriousBroker.it  This test is not yet approved or cleared by the [place] FDA and has been authorized for detection and/or diagnosis of SARS-CoV-2 by FDA under an Emergency Use Authorization (EUA). This EUA will remain in effect (meaning this test can be used) for the duration of the COVID-19 declaration under Section 564(b)(1) of the Act, 21 U.S.C. section 360bbb-3(b)(1), unless the authorization  is terminated or revoked.  Performed at [facility], [address] [place], [place] [address]   Urine rapid drug screen (hosp performed)     Status: None   Collection Time: 08/23/20  8:02 PM  Result Value Ref Range   Opiates NONE DETECTED NONE DETECTED   Cocaine NONE DETECTED NONE DETECTED   Benzodiazepines NONE DETECTED NONE DETECTED   Amphetamines NONE DETECTED NONE DETECTED   Tetrahydrocannabinol NONE DETECTED NONE DETECTED   Barbiturates NONE DETECTED NONE DETECTED    Comment: (NOTE) DRUG SCREEN FOR MEDICAL PURPOSES ONLY.  IF CONFIRMATION IS NEEDED FOR ANY PURPOSE, NOTIFY LAB WITHIN 5 DAYS.  LOWEST DETECTABLE LIMITS FOR URINE DRUG SCREEN Drug Class                     Cutoff  (ng/mL) Amphetamine and metabolites    1000 Barbiturate and metabolites    200 Benzodiazepine                 200 Tricyclics and metabolites     300 Opiates and metabolites        300 Cocaine and metabolites        300 THC                            50 Performed at [facility], [address], [place], [place] [address]   Comprehensive metabolic panel     Status: Abnormal   Collection Time: 08/23/20  8:10 PM  Result Value Ref Range   Sodium 139 135 - 145 mmol/L   Potassium 3.8 3.5 - 5.1 mmol/L   Chloride 105 98 - 111 mmol/L   CO2 26 22 - 32 mmol/L   Glucose, Bld 99 70 - 99 mg/dL    Comment: Glucose reference range applies only to samples taken after fasting for at least 8 hours.   BUN 8 6 - 20 mg/dL   Creatinine, Ser 2.84 0.61 - 1.24 mg/dL   Calcium 8.5 (L) 8.9 - 10.3 mg/dL   Total Protein 7.2 6.5 - 8.1 g/dL   Albumin 3.9 3.5 - 5.0 g/dL   AST 38 15 - 41 U/L   ALT 34 0 - 44 U/L   Alkaline Phosphatase 82 38 - 126 U/L   Total Bilirubin 0.5 0.3 - 1.2 mg/dL   GFR, Estimated >13 >24 mL/min    Comment: (NOTE) Calculated using the CKD-EPI Creatinine Equation (2021)    Anion gap 8 5 - 15    Comment: Performed at [facility], [address] [place], [place] [address]  Ethanol     Status: Abnormal   Collection Time: 08/23/20  8:10 PM  Result Value Ref Range   Alcohol, Ethyl (B) 33 (H) <10 mg/dL    Comment: (NOTE) Lowest detectable limit for serum alcohol is 10 mg/dL.  For medical purposes only. Performed at [facility], [address], [place], [place] [address]   CBC with Diff     Status: None   Collection Time: 08/23/20  8:10 PM  Result Value Ref Range   WBC 6.2 4.0 - 10.5 K/uL   RBC 5.10 4.22 - 5.81 MIL/uL   Hemoglobin 15.9 13.0 - 17.0 g/dL   HCT 64.4 03.4 - 74.2 %   MCV 91.2 80.0 - 100.0 fL   MCH 31.2 26.0 - 34.0 pg   MCHC 34.2 30.0 - 36.0 g/dL   RDW 13.7  11.5 - 15.5 %   Platelets 294 150 - 400 K/uL   nRBC  0.0 0.0 - 0.2 %   Neutrophils Relative % 65 %   Neutro Abs 4.0 1.7 - 7.7 K/uL   Lymphocytes Relative 18 %   Lymphs Abs 1.1 0.7 - 4.0 K/uL   Monocytes Relative 12 %   Monocytes Absolute 0.8 0.1 - 1.0 K/uL   Eosinophils Relative 3 %   Eosinophils Absolute 0.2 0.0 - 0.5 K/uL   Basophils Relative 1 %   Basophils Absolute 0.1 0.0 - 0.1 K/uL   Immature Granulocytes 1 %   Abs Immature Granulocytes 0.05 0.00 - 0.07 K/uL    Comment: Performed at [facility], [address] [place], [place] [id number]    Blood Alcohol level:  Lab Results  Component Value Date   ETH 33 (H) 08/23/2020   ETH <10 06/08/2019    Physical Findings: AIMS:  , ,  ,  ,    CIWA:    COWS:     Musculoskeletal: Strength & Muscle Tone: within normal limits Gait & Station: normal Patient leans: N/A  Psychiatric Specialty Exam:  Presentation  General Appearance: Appropriate for Environment  Eye Contact:Good  Speech:Clear and Coherent; Normal Rate  Speech Volume:Normal  Handedness:Right   Mood and Affect  Mood:Depressed  Affect:Congruent   Thought Process  Thought Processes:Coherent; Goal Directed  Descriptions of Associations:Intact  Orientation:Full (Time, Place and Person)  Thought Content:WDL  History of Schizophrenia/Schizoaffective disorder:Yes  Duration of Psychotic Symptoms:Greater than six months  Hallucinations:Hallucinations: Auditory; Visual Description of Auditory Hallucinations: scream at me to hurt myself Description of Visual Hallucinations: I think its dead people  Ideas of Reference:Paranoia  Suicidal Thoughts:Suicidal Thoughts: Yes, Active SI Active Intent and/or Plan: With Intent; With Plan SI Passive Intent and/or Plan: Without Intent; Without Plan (Patient reporting suicidal thoughts without specific plan.  Does report several suicde attempts in the past)  Homicidal Thoughts:Homicidal Thoughts: No  Sensorium  Memory: Immediate Good; Recent  Good; Remote Good Judgment:Poor  Insight:Poor   Executive Functions  Concentration:Fair  Attention Span:Good  Recall:Good  Fund of Knowledge:Good  Language:Good   Psychomotor Activity  Psychomotor Activity:Psychomotor Activity: Normal   Assets  Assets:Communication Skills; Desire for Improvement; Resilience; Leisure Time   Sleep  Sleep:Sleep: Fair    Physical Exam: Physical Exam Cardiovascular:     Rate and Rhythm: Normal rate.  Pulmonary:     Effort: Pulmonary effort is normal.  Skin:    General: Skin is warm and dry.  Neurological:     Mental Status: He is alert and oriented to person, place, and time.  Psychiatric:        Attention and Perception: Attention normal. He perceives auditory and visual hallucinations.        Mood and Affect: Mood is depressed.        Speech: Speech normal.        Behavior: Behavior is cooperative.        Thought Content: Thought content is not paranoid or delusional. Thought content includes suicidal ideation. Thought content does not include homicidal ideation. Thought content includes suicidal plan. Thought content does not include homicidal plan.   Review of Systems  Constitutional:  Negative for chills and fever.  Respiratory:  Negative for shortness of breath.   Cardiovascular:  Negative for chest pain.  Gastrointestinal:  Negative for abdominal pain, nausea and vomiting.  Neurological:  Negative for headaches.  Psychiatric/Behavioral:  Positive for depression, hallucinations and suicidal ideas.  Negative for substance abuse.   Blood pressure (!) 147/99, pulse 93, temperature 98.8 F (37.1 C), temperature source Oral, resp. rate 16, height 5\' 8"  (1.727 m), weight 67.1 kg, SpO2 100 %. Body mass index is 22.5 kg/m.  Treatment Plan Summary: Plan Meets criteria for  inpatient psychiatric admission. Agrees with plan. ED staff monitor for safety and stabilization while awaiting inpatient admission.   ,  NP 08/24/2020, 12:20 PM

## 2020-08-24 NOTE — Progress Notes (Signed)
Pt stayed in room much of the evening    08/24/20 2200  Psych Admission Type (Psych Patients Only)  Admission Status Voluntary  Psychosocial Assessment  Patient Complaints Depression  Eye Contact Brief  Facial Expression Flat;Sad  Affect Depressed;Blunted  Speech Soft  Interaction Assertive  Motor Activity Slow  Appearance/Hygiene In scrubs  Behavior Characteristics Cooperative  Mood Pleasant  Thought Process  Coherency WDL  Content WDL  Delusions WDL  Perception WDL  Hallucination Visual;Auditory  Judgment Impaired  Confusion WDL  Danger to Self  Current suicidal ideation? Passive  Self-Injurious Behavior Some self-injurious ideation observed or expressed.  No lethal plan expressed   Agreement Not to Harm Self Yes  Description of Agreement verbally contracts to notify staff if he has intentions of wanting to hurt himself.  Danger to Others  Danger to Others None reported or observed

## 2020-08-24 NOTE — Progress Notes (Signed)
Pt is a 45 y.o. male admitted voluntarily at [facility] due to suicidal ideation.  Pt reports SI during intake at [facility], and he says his plan is that he would walk into traffic to kill himself.  Pt also endorses auditory and visual hallucinations.  Pt said he has command hallucinations that tell pt to kill himself and that pt is no good as a person.  Pt said he hears 4 different voices.  Pt said he also has visual hallucinations and sees "dead people."   Pt got into an argument with his roommate at [facility] and was told he had to leave the apartment. Pt was kicked out of [place] house on Sunday 08-20-20 and he lost his job on Monday 08-21-20.  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  Urine Drug screen was negative, ETOH level was 33 on admission.  Pt reports that he has family who lives in [place], but that family is not supportive to pt and pt feels helpless and alone.  Pt has history of asthma.  Pt has asthma which he uses an inhaler for as needed. Pt signed consent forms.  RN oriented pt to the unit.  RN initiated q 15 min safety checks.

## 2020-08-24 NOTE — ED Notes (Signed)
Report called to receiving nurse at [facility].

## 2020-08-25 DIAGNOSIS — F1020 Alcohol dependence, uncomplicated: Secondary | ICD-10-CM

## 2020-08-25 LAB — HEMOGLOBIN A1C
Hgb A1c MFr Bld: 5.8 % — ABNORMAL HIGH (ref 4.8–5.6)
Mean Plasma Glucose: 119.76 mg/dL

## 2020-08-25 LAB — LIPID PANEL
Cholesterol: 211 mg/dL — ABNORMAL HIGH (ref 0–200)
HDL: 73 mg/dL (ref >40)
LDL Cholesterol: 124 mg/dL — ABNORMAL HIGH (ref 0–99)
Total CHOL/HDL Ratio: 2.9 RATIO
Triglycerides: 71 mg/dL (ref <150)
VLDL: 14 mg/dL (ref 0–40)

## 2020-08-25 LAB — TSH: TSH: 1.835 u[IU]/mL (ref 0.350–4.500)

## 2020-08-25 MED ORDER — RISPERIDONE 0.5 MG PO TABS
0.5000 mg | ORAL_TABLET | Freq: Two times a day (BID) | ORAL | Status: DC
  Administered 2020-08-25 – 2020-08-26 (×3): 0.5 mg via ORAL
  Filled 2020-08-25 (×2): qty 14
  Filled 2020-08-25 (×4): qty 1
  Filled 2020-08-25: qty 14
  Filled 2020-08-25 (×2): qty 1
  Filled 2020-08-25: qty 14

## 2020-08-25 NOTE — Progress Notes (Signed)
Pt is A&Ox4, denies SI/HI/AVH, is calm, cooperative, isolative, medication compliant. Will continue to monitor pt per Q15 minute face checks and monitor for safety and progress.

## 2020-08-25 NOTE — Tx Team (Signed)
Interdisciplinary Treatment and Diagnostic Plan Update  08/25/2020 Time of Session:  Ricky Watson MRN: [id number]  Principal Diagnosis: <principal problem not specified>  Secondary Diagnoses: Active Problems:   Schizoaffective disorder (HCC)   Current Medications:  Current Facility-Administered Medications  Medication Dose Route Frequency Provider Last Rate Last Admin   acetaminophen (TYLENOL) tablet 650 mg  650 mg Oral Q6H PRN Chalmers Guest, NP   650 mg at 08/24/20 1741   albuterol (VENTOLIN HFA) 108 (90 Base) MCG/ACT inhaler 2 puff  2 puff Inhalation Q6H PRN Chalmers Guest, NP       alum & mag hydroxide-simeth (MAALOX/MYLANTA) 200-200-20 MG/5ML suspension 30 mL  30 mL Oral Q4H PRN Chalmers Guest, NP       folic acid (FOLVITE) tablet 1 mg  1 mg Oral Daily Sharma Covert, MD   1 mg at 08/24/20 1745   hydrOXYzine (ATARAX/VISTARIL) tablet 25 mg  25 mg Oral TID PRN Chalmers Guest, NP   25 mg at 08/25/20 8588   LORazepam (ATIVAN) tablet 1 mg  1 mg Oral Q6H PRN Sharma Covert, MD       magnesium hydroxide (MILK OF MAGNESIA) suspension 30 mL  30 mL Oral Daily PRN Chalmers Guest, NP       nicotine (NICODERM CQ - dosed in mg/24 hours) patch 21 mg  21 mg Transdermal Daily Margorie John W, PA-C   21 mg at 08/25/20 0803   OLANZapine zydis (ZYPREXA) disintegrating tablet 5 mg  5 mg Oral QHS Chalmers Guest, NP       pantoprazole (PROTONIX) EC tablet 40 mg  40 mg Oral Daily Sharma Covert, MD   40 mg at 08/24/20 1741   thiamine (B-1) injection 100 mg  100 mg Intramuscular Once Sharma Covert, MD       thiamine tablet 100 mg  100 mg Oral Daily Sharma Covert, MD   100 mg at 08/24/20 1741   traZODone (DESYREL) tablet 50 mg  50 mg Oral QHS PRN Chalmers Guest, NP       PTA Medications: Medications Prior to Admission  Medication Sig Dispense Refill Last Dose   albuterol (VENTOLIN HFA) 108 (90 Base) MCG/ACT inhaler Inhale 2 puffs into the lungs every 6 (six) hours as needed for  wheezing or shortness of breath. 1 each 0    Aspirin-Acetaminophen-Caffeine (GOODY HEADACHE PO) Take 1-3 packets by mouth daily as needed (for headaches).      BUSPIRONE HCL PO Take 1 tablet by mouth in the morning.      loperamide (IMODIUM) 2 MG capsule Take 1 capsule (2 mg total) by mouth 4 (four) times daily as needed for diarrhea or loose stools. (Patient not taking: Reported on 08/23/2020) 12 capsule 0    ondansetron (ZOFRAN ODT) 8 MG disintegrating tablet Take 1 tablet (8 mg total) by mouth every 8 (eight) hours as needed for nausea or vomiting. (Patient not taking: Reported on 08/23/2020) 20 tablet 0    QUETIAPINE FUMARATE PO Take 1 tablet by mouth at bedtime.      RISPERIDONE PO Take 1 tablet by mouth in the morning and at bedtime.       Patient Stressors: Financial difficulties Medication change or noncompliance Occupational concerns Substance abuse  Patient Strengths: Ability for insight Communication skills Work skills  Treatment Modalities: Medication Management, Group therapy, Case management,  1 to 1 session with clinician, Psychoeducation, Recreational therapy.   Physician Treatment Plan for Primary Diagnosis: <  principal problem not specified> Long Term Goal(s): Improvement in symptoms so as ready for discharge   Short Term Goals: Ability to identify changes in lifestyle to reduce recurrence of condition will improve Ability to verbalize feelings will improve Ability to disclose and discuss suicidal ideas Ability to demonstrate self-control will improve Ability to identify and develop effective coping behaviors will improve Compliance with prescribed medications will improve Ability to identify triggers associated with substance abuse/mental health issues will improve  Medication Management: Evaluate patient's response, side effects, and tolerance of medication regimen.  Therapeutic Interventions: 1 to 1 sessions, Unit Group sessions and Medication  administration.  Evaluation of Outcomes: Not Met  Physician Treatment Plan for Secondary Diagnosis: Active Problems:   Schizoaffective disorder ([place])  Long Term Goal(s): Improvement in symptoms so as ready for discharge   Short Term Goals: Ability to identify changes in lifestyle to reduce recurrence of condition will improve Ability to verbalize feelings will improve Ability to disclose and discuss suicidal ideas Ability to demonstrate self-control will improve Ability to identify and develop effective coping behaviors will improve Compliance with prescribed medications will improve Ability to identify triggers associated with substance abuse/mental health issues will improve     Medication Management: Evaluate patient's response, side effects, and tolerance of medication regimen.  Therapeutic Interventions: 1 to 1 sessions, Unit Group sessions and Medication administration.  Evaluation of Outcomes: Not Met   RN Treatment Plan for Primary Diagnosis: <principal problem not specified> Long Term Goal(s): Knowledge of disease and therapeutic regimen to maintain health will improve  Short Term Goals: Ability to demonstrate self-control, Ability to verbalize feelings will improve, and Ability to disclose and discuss suicidal ideas  Medication Management: RN will administer medications as ordered by provider, will assess and evaluate patient's response and provide education to patient for prescribed medication. RN will report any adverse and/or side effects to prescribing provider.  Therapeutic Interventions: 1 on 1 counseling sessions, Psychoeducation, Medication administration, Evaluate responses to treatment, Monitor vital signs and CBGs as ordered, Perform/monitor CIWA, COWS, AIMS and Fall Risk screenings as ordered, Perform wound care treatments as ordered.  Evaluation of Outcomes: Not Met   LCSW Treatment Plan for Primary Diagnosis: <principal problem not specified> Long Term  Goal(s): Safe transition to appropriate next level of care at discharge, Engage patient in therapeutic group addressing interpersonal concerns.  Short Term Goals: Engage patient in aftercare planning with referrals and resources, Increase emotional regulation, and Facilitate acceptance of mental health diagnosis and concerns  Therapeutic Interventions: Assess for all discharge needs, 1 to 1 time with Social worker, Explore available resources and support systems, Assess for adequacy in community support network, Educate family and significant other(s) on suicide prevention, Complete Psychosocial Assessment, Interpersonal group therapy.  Evaluation of Outcomes: Not Met   Progress in Treatment: Attending groups: No. and As evidenced by:  pt was recently admitted Participating in groups: No. and As evidenced by:  pt was recently admitted Taking medication as prescribed: Yes. Toleration medication: Yes. Family/Significant other contact made: No, will contact:  CSW will obtain consents Patient understands diagnosis: Yes. Discussing patient identified problems/goals with staff: Yes. Medical problems stabilized or resolved: Yes. Denies suicidal/homicidal ideation: No. Issues/concerns per patient self-inventory: No. Other: None  New problem(s) identified: No, Describe:  None  New Short Term/Long Term Goal(s):medication stabilization, elimination of SI thoughts, development of comprehensive mental wellness plan.   Patient Goals:  "get stable"  Discharge Plan or Barriers: Patient recently admitted. CSW will continue to follow and assess for  appropriate referrals and possible discharge planning.   Reason for Continuation of Hospitalization: Hallucinations Medication stabilization Suicidal ideation  Estimated Length of Stay: 3-5 days  Attendees: Patient: Ricky Watson 08/25/2020   Physician: Dr. Kai Levins 08/25/2020   Nursing:  08/25/2020   RN Care Manager: 08/25/2020   Social Worker:  Toney Reil, [place] 08/25/2020   Recreational Therapist:  08/25/2020   Other:  08/25/2020   Other:  08/25/2020   Other: 08/25/2020     Scribe for Treatment Team: Mliss Fritz, Latanya Presser 08/25/2020 9:53 AM

## 2020-08-25 NOTE — [facility] Suicide Risk Assessment (Signed)
[facility] Admission Suicide Risk Assessment   Nursing information obtained from:  Patient Demographic factors:  Male, Caucasian, Low socioeconomic status, Living alone, Unemployed Current Mental Status:  Suicidal ideation indicated by patient Loss Factors:  Financial problems / change in socioeconomic status, Decrease in vocational status Historical Factors:  Prior suicide attempts, Impulsivity Risk Reduction Factors:  NA  Total Time spent with patient: 30 minutes Principal Problem: <principal problem not specified> Diagnosis:  Active Problems:   Schizoaffective disorder (HCC)  Subjective Data: Patient is seen and examined.  Patient is a 45 year old male with a reported past psychiatric history significant for schizoaffective disorder, alcohol use disorder, methamphetamine use disorder and malingering who originally presented to the [facility] on 08/23/2020 after having called 911 reporting command auditory hallucinations and suicidal ideation.  The patient had stated that he had been off medications for approximately 1 month, and felt better and did not think that he needed them.  He had apparently been in an [employer], got into an argument with his roommate, got kicked out and was now homeless.  The auditory hallucinations were telling him to kill himself.  On my evaluation he initially stated that he had been sober for some time, but then admitted that he had drank alcohol the day of admission but "not that much".  His blood alcohol in evaluation was 33.  He reported that he had attempted to cut his neck a year ago and was on the inpatient facility at [place], [place].  This was apparently a superficial wound.  The decision was made to admit him to our hospital for evaluation and stabilization.  This morning the patient stated that he is not feeling suicidal since he is safe in our facility.  He stated that he has on and off hallucinations and that the  hallucinations are located in his head.  He was last seen by psychiatry apparently in June 2021.  He was seen in the emergency room in [place], [place] and discharged on Zyprexa, Restoril and B vitamins.  On 06/22/2019 he was seen in an emergency department in [place] at the [facility].  He presented there with auditory hallucinations telling him to kill himself for the last 2 days.  He stated that he had been previously on Risperdal as well as lithium but "ran out".  He told the physicians there that he was visiting from [place], but had to work at [employer] but left his job secondary to the hallucinations.  He apparently was admitted to a psychiatric facility at that time.  There is a note from 06/07/2019 where he presented to the [facility] who was seen in psychiatric consultation due to suicidal ideation.  On interview at that time the patient needed housing, and it was not until discharge planning was discussed that the patient suddenly endorsed suicidal ideation.  At that time he became agitated and began to threaten staff members.  At that time he also agreed that he was malingering for housing.  He had had 2 previous hospitalizations to that facility and was suspected of malingering at that time.  Today he admitted that he had stopped his medications, and that he had been on multiple medications in the past.  He was unable to quantitate whether or not the medications had ever been effective.  He was admitted to the hospital for evaluation and stabilization.  Continued Clinical Symptoms:  Alcohol Use Disorder Identification Test Final Score (  AUDIT): 2 The "Alcohol Use Disorders Identification Test", Guidelines for Use in Primary Care, Second Edition.  World Science writer [facility]). Score between 0-7:  no or low risk or alcohol related problems. Score between 8-15:  moderate risk of alcohol related problems. Score between 16-19:  high  risk of alcohol related problems. Score 20 or above:  warrants further diagnostic evaluation for alcohol dependence and treatment.   CLINICAL FACTORS:   Depression:   Comorbid alcohol abuse/dependence Impulsivity Alcohol/Substance Abuse/Dependencies Currently Psychotic   Musculoskeletal: Strength & Muscle Tone: within normal limits Gait & Station: normal Patient leans: N/A  Psychiatric Specialty Exam:  Presentation  General Appearance: Appropriate for Environment  Eye Contact:Good  Speech:Clear and Coherent; Normal Rate  Speech Volume:Normal  Handedness:Right   Mood and Affect  Mood:Depressed  Affect:Congruent   Thought Process  Thought Processes:Coherent; Goal Directed  Descriptions of Associations:Intact  Orientation:Full (Time, Place and Person)  Thought Content:WDL  History of Schizophrenia/Schizoaffective disorder:Yes  Duration of Psychotic Symptoms:Greater than six months  Hallucinations:Hallucinations: Auditory; Visual Description of Auditory Hallucinations: scream at me to hurt myself Description of Visual Hallucinations: I think its dead people  Ideas of Reference:Paranoia  Suicidal Thoughts:Suicidal Thoughts: Yes, Active SI Active Intent and/or Plan: With Intent; With Plan  Homicidal Thoughts:Homicidal Thoughts: No   Sensorium  Memory:Immediate Good; Recent Good; Remote Good  Judgment:Poor  Insight:Poor   Executive Functions  Concentration:Fair  Attention Span:Good  Recall:Good  Fund of Knowledge:Good  Language:Good   Psychomotor Activity  Psychomotor Activity:Psychomotor Activity: Normal   Assets  Assets:Communication Skills; Desire for Improvement; Resilience; Leisure Time   Sleep  Sleep:Sleep: Fair    Physical Exam: Physical Exam Vitals and nursing note reviewed.  HENT:     Head: Normocephalic and atraumatic.  Pulmonary:     Effort: Pulmonary effort is normal.  Neurological:     General: No focal  deficit present.     Mental Status: He is alert and oriented to person, place, and time.   Review of Systems  All other systems reviewed and are negative. Blood pressure (!) 130/98, pulse (!) 104, temperature 98.1 F (36.7 C), temperature source Oral, resp. rate 18, height 5\' 8"  (1.727 m), weight 67.1 kg, SpO2 97 %. Body mass index is 22.5 kg/m.   COGNITIVE FEATURES THAT CONTRIBUTE TO RISK:  None    SUICIDE RISK:   Minimal: No identifiable suicidal ideation.  Patients presenting with no risk factors but with morbid ruminations; may be classified as minimal risk based on the severity of the depressive symptoms  PLAN OF CARE: Patient is seen and examined.  Patient is a 45 year old male with the above-stated past psychiatric history who was admitted with reported suicidal ideation and auditory hallucinations.  He will be admitted to the hospital.  He will be integrated in the milieu.  He will be encouraged to attend groups.  I suspect that the patient is probably been admitted to the hospital for secondary gain.  He does appear that at least the last 5 psychiatric hospitalizations that he has had were secondary to housing.  Clearly he was recently kicked out of the [place] [facility] house, and that probably generated this visit.  We will go on and restart Risperdal.  We will do this with low dosage.  I will start at 1 mg p.o. nightly.  I will also place him on lorazepam 1 mg p.o. every 6 hours as needed a CIWA greater than 10.  We will also give him folic acid as well as thiamine.  He apparently has had a positive stool guaiac, and we will obtain 2 more of those just in case.  Last night I started him on Protonix 40 mg p.o. daily in case he had gastric erosions from his alcoholism.  He will also have available trazodone for sleep.  Review of his admission laboratories revealed essentially normal electrolytes including potassium and a creatinine of 0.69.  His AST and ALT were normal.  Lipid panel showed an  elevation of his total cholesterol at 211.  The rest of his lipid panel was normal.  CBC was essentially normal including a normal MCV.  Differential was completely normal.  His hemoglobin A1c was 5.8.  TSH was normal at 1.835.  Respiratory panel was negative for influenza A, B and coronavirus.  His blood alcohol on admission was 33.  Drug screen was negative.  His EKG showed a normal sinus rhythm with a normal QTc interval.  His blood pressure is mildly elevated today at 130/98, pulse was 104.  This may be related to alcohol withdrawal symptoms, but we will treated as necessary.  I certify that inpatient services furnished can reasonably be expected to improve the patient's condition.   Antonieta Pert, MD 08/25/2020, 10:27 AM

## 2020-08-25 NOTE — [facility] Suicide Risk Assessment (Signed)
[facility] INPATIENT:  Family/Significant Other Suicide Prevention Education  Suicide Prevention Education:  Patient Refusal for Family/Significant Other Suicide Prevention Education: The patient Ricky Watson has refused to provide written consent for family/significant other to be provided Family/Significant Other Suicide Prevention Education during admission and/or prior to discharge.  Physician notified.  Metro Kung Sutter Ahlgren 08/25/2020, 10:29 AM
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
## 2020-08-25 NOTE — Progress Notes (Signed)
Patient did not attend the evening speaker AA meeting.  

## 2020-08-25 NOTE — [facility] Group Notes (Signed)
Type of Therapy and Topic: Group Therapy: Overcoming Obstacles  Participation Level: Did Not Attend  Description of Group: In this group patients will be encouraged to explore what they see as obstacles to their own wellness and recovery. They will be guided to discuss their thoughts, feelings, and behaviors related to these obstacles. The group will process together ways to cope with barriers, with attention given to specific choices patients can make. Each patient will be challenged to identify changes they are motivated to make in order to overcome their obstacles. This group will be process-oriented, with patients participating in exploration of their own experiences as well as giving and receiving support and challenge from other group members.  Therapeutic Goals: 1. Patient will identify personal and current obstacles as they relate to admission. 2. Patient will identify barriers that currently interfere with their wellness or overcoming obstacles. 3. Patient will identify feelings, thought process and behaviors related to these barriers. 4. Patient will identify two changes they are willing to make to overcome these obstacles:  Summary of Patient Progress: Did not attend  
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
## 2020-08-25 NOTE — BHH Counselor (Signed)
CSW provided the Pt with a packet that includes: housing and shelter resources, free/reduced price food resources, IRC information, GoodRX Cards, and suicide prevention information.   

## 2020-08-26 MED ORDER — NICOTINE 21 MG/24HR TD PT24: 21.0000 mg | MEDICATED_PATCH | Freq: Every day | TRANSDERMAL | 0 refills | Status: DC

## 2020-08-26 MED ORDER — RISPERIDONE 0.5 MG PO TABS: 0.5000 mg | ORAL_TABLET | Freq: Two times a day (BID) | ORAL | 0 refills | Status: DC

## 2020-08-26 NOTE — BHH Group Notes (Addendum)
LCSW Group Therapy Note 08/26/2020  10:00-11:00am  Type of Therapy and Topic:  Group Therapy: Anger and Commonalities  Participation Level:  Active   Description of Group: In this group, patients initially shared an "unknown" fact about themselves and CSW led a discussion about the ways in which we have things in common without realizing it.  Patient then identified a recent time they became angry and how this yet again showed a way in which they had something in common with other patients.  We discussed possible unhealthy reactions to anger and possible healthy reactions.  We also discussed possible underlying emotions that lead to the anger.  Commonalities among group members were pointed out throughout the entirety of group.  Therapeutic Goals: Patients were asked to share something about themselves and learned that they often have things in common with other people without knowing this Patients will remember their last incident of anger and how they reacted Patients will be able to identify their reaction as healthy or unhealthy, and identify possible reactions that would have been the opposite Patients will learn that anger itself is a secondary emotion and will think about their primary emotion at the time of their last incident of anger  Summary of Patient Progress:  The patient shared that something interesting he could share about himself is that he was born and raised in [place].  This fact of being raised elsewhere was expressed as also present in several other patients, so the patient was able to recognize that he is not alone.  The patient also said a current cause of anger is that his 16yo daughter was killed 2 years ago in a car accident and her mother did not tell him about it.   He said he took this out on himself with internalizing thoughts about how he could/should have been a better father.  His conclusion by the end of group was that this chosen method of coping is unhealthy.   He was asked out of group by a provider and did not return.  However, while he was present, he interacted extensively with the group throughout the discussion, was supportive of others and talked about his anger appropriately.  Therapeutic Modalities:   Cognitive Behavioral Therapy  Lynnell Chad, LCSW 08/26/2020  9:46 AM

## 2020-08-26 NOTE — Progress Notes (Signed)
D: Pt A & O X 4. Denies SI, HI, AVH and pain at this time. D/C as ordered. Bus passes X2 given to pt at time of d/c. A: D/C instructions reviewed with pt including prescriptions, medication samples and follow up appointment; compliance encouraged. All belongings from assigned locker given to pt at time of departure. Scheduled and PRN medications given with verbal education and effects monitored. Safety checks maintained without incident till time of d/c.  R: Pt receptive to care. Compliant with medications when offered. Denies adverse drug reactions when assessed. Verbalized understanding related to d/c instructions. Signed belonging sheet in agreement with items received from locker. Ambulatory with a steady gait. Appears to be in no physical distress at time of departure.

## 2020-08-26 NOTE — Progress Notes (Deleted)
     08/25/20 2106  Psych Admission Type (Psych Patients Only)  Admission Status Voluntary  Psychosocial Assessment  Patient Complaints Anxiety;Depression  Eye Contact Brief  Facial Expression Flat  Affect Depressed;Blunted  Speech Soft  Interaction Assertive  Motor Activity Slow  Appearance/Hygiene In scrubs  Behavior Characteristics Cooperative;Calm  Mood Pleasant  Thought Process  Coherency WDL  Content WDL  Delusions WDL  Perception Hallucinations  Hallucination Visual;Auditory (visions as shadows and people; voices saying I am worthless)  Judgment Impaired  Confusion WDL  Danger to Self  Current suicidal ideation? Denies  Self-Injurious Behavior Some self-injurious ideation observed or expressed.  No lethal plan expressed   Agreement Not to Harm Self Yes  Description of Agreement verbally contracts to notify staff if he has intentions of wanting to hurt himself.  Danger to Others  Danger to Others None reported or observed

## 2020-08-26 NOTE — BHH Suicide Risk Assessment (Signed)
[facility] Discharge Suicide Risk Assessment   Principal Problem: Alcohol dependence (HCC) Discharge Diagnoses: Principal Problem:   Alcohol dependence (HCC) Active Problems:   Schizoaffective disorder (HCC)   Total Time spent with patient: 15 minutes  Musculoskeletal: Strength & Muscle Tone: within normal limits Gait & Station: normal Patient leans: N/A  Psychiatric Specialty Exam  Presentation  General Appearance: Fairly Groomed  Eye Contact:Fair  Speech:Normal Rate  Speech Volume:Normal  Handedness:Right   Mood and Affect  Mood:Euthymic  Duration of Depression Symptoms: Less than two weeks  Affect:Congruent   Thought Process  Thought Processes:Coherent  Descriptions of Associations:Circumstantial  Orientation:Full (Time, Place and Person)  Thought Content:Logical  History of Schizophrenia/Schizoaffective disorder:Yes  Duration of Psychotic Symptoms:Greater than six months  Hallucinations:Hallucinations: Auditory Description of Auditory Hallucinations: voices telling him to hurt himself Description of Visual Hallucinations: sees people that he thinks are dead  Ideas of Reference:None  Suicidal Thoughts:Suicidal Thoughts: No SI Active Intent and/or Plan: With Plan; With Intent  Homicidal Thoughts:Homicidal Thoughts: No   Sensorium  Memory:Immediate Fair; Recent Fair; Remote Fair  Judgment:Impaired  Insight:Lacking   Executive Functions  Concentration:Good  Attention Span:Good  Recall:Poor  Fund of Knowledge:Fair  Language:Good   Psychomotor Activity  Psychomotor Activity:Psychomotor Activity: Normal   Assets  Assets:Desire for Improvement; Resilience   Sleep  Sleep:Sleep: Good Number of Hours of Sleep: 6   Physical Exam: Physical Exam Vitals and nursing note reviewed.  Constitutional:      Appearance: Normal appearance.  HENT:     Head: Normocephalic and atraumatic.  Pulmonary:     Effort: Pulmonary effort is normal.   Neurological:     General: No focal deficit present.     Mental Status: He is alert and oriented to person, place, and time.   ROS Blood pressure (!) 120/91, pulse (!) 113, temperature 97.7 F (36.5 C), temperature source Oral, resp. rate 18, height 5\' 8"  (1.727 m), weight 67.1 kg, SpO2 99 %. Body mass index is 22.5 kg/m.  Mental Status Per Nursing Assessment::   On Admission:  Suicidal ideation indicated by patient  Demographic Factors:  Male, Caucasian, Low socioeconomic status, Living alone, and Unemployed  Loss Factors: Financial problems/change in socioeconomic status  Historical Factors: Impulsivity  Risk Reduction Factors:   NA  Continued Clinical Symptoms:  Alcohol/Substance Abuse/Dependencies Schizophrenia:   Depressive state  Cognitive Features That Contribute To Risk:  None    Suicide Risk:  Minimal: No identifiable suicidal ideation.  Patients presenting with no risk factors but with morbid ruminations; may be classified as minimal risk based on the severity of the depressive symptoms   Follow-up Information     [facility]. Go to.   Specialty: Behavioral Health Why: Please go to this provider for therapy and medication management services during walk in hours:  Monday through Wednesday, from 7:45 am to 11:00 am.  Services are provided on a first come, first served basis. Contact information: [address] [place] [phone]                Plan Of Care/Follow-up recommendations:  Activity:  ad lib  [phone], MD 08/26/2020, 9:51 AM
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
## 2020-08-26 NOTE — Progress Notes (Signed)
   08/25/20 2106  Psych Admission Type (Psych Patients Only)  Admission Status Voluntary  Psychosocial Assessment  Patient Complaints Anxiety;Depression  Eye Contact Brief  Facial Expression Flat  Affect Depressed;Blunted  Speech Soft  Interaction Assertive  Motor Activity Slow  Appearance/Hygiene In scrubs  Behavior Characteristics Cooperative;Calm  Mood Pleasant  Thought Process  Coherency WDL  Content WDL  Delusions WDL  Perception Hallucinations  Hallucination Visual;Auditory (visions as shadows and people; voices saying I am worthless)  Judgment Impaired  Confusion WDL  Danger to Self  Current suicidal ideation? Passive  Self-Injurious Behavior Some self-injurious ideation observed or expressed.  No lethal plan expressed   Agreement Not to Harm Self Yes  Description of Agreement verbally contracts to notify staff if he has intentions of wanting to hurt himself.  Danger to Others  Danger to Others None reported or observed

## 2020-08-26 NOTE — Plan of Care (Signed)
  Problem: Education: Goal: Emotional status will improve Outcome: Not Progressing Goal: Mental status will improve Outcome: Not Progressing Goal: Verbalization of understanding the information provided will improve Outcome: Not Progressing   

## 2020-08-26 NOTE — Progress Notes (Signed)
  [facility] Adult Case Management Discharge Plan :  Will you be returning to the same living situation after discharge:  Yes,  homeless "the street" At discharge, do you have transportation home?: Yes,  bus pass provided Do you have the ability to pay for your medications: No.  Release of information consent forms completed and emailed to Medical Records, then turned in to Medical Records by CSW.   Patient to Follow up at:  Follow-up Information     [facility]. Go to.   Specialty: Behavioral Health Why: Please go to this provider for therapy and medication management services during walk in hours:  Monday through Wednesday, from 7:45 am to 11:00 am.  Services are provided on a first come, first served basis. Contact information: [address] [place] [address] [phone]                Next level of care provider has access to [facility] Link:yes  Safety Planning and Suicide Prevention discussed: No.     Has patient been referred to the Quitline?: Patient refused referral  Patient has been referred for addiction treatment: Yes  Lynnell Chad, LCSW 08/26/2020, 9:35 AM

## 2020-08-31 ENCOUNTER — Other Ambulatory Visit: Payer: Self-pay

## 2020-08-31 ENCOUNTER — Other Ambulatory Visit (HOSPITAL_COMMUNITY)
Admission: EM | Admit: 2020-08-31 | Discharge: 2020-09-04 | Disposition: A | Discharge status: Home / Self Care | Payer: No Payment, Other | Attending: Psychiatry | Admitting: Psychiatry

## 2020-08-31 DIAGNOSIS — F1021 Alcohol dependence, in remission: Secondary | ICD-10-CM

## 2020-08-31 DIAGNOSIS — F1024 Alcohol dependence with alcohol-induced mood disorder: Secondary | ICD-10-CM

## 2020-08-31 DIAGNOSIS — F259 Schizoaffective disorder, unspecified: Secondary | ICD-10-CM | POA: Diagnosis not present

## 2020-08-31 DIAGNOSIS — Z20822 Contact with and (suspected) exposure to covid-19: Secondary | ICD-10-CM | POA: Insufficient documentation

## 2020-08-31 DIAGNOSIS — F1020 Alcohol dependence, uncomplicated: Secondary | ICD-10-CM | POA: Insufficient documentation

## 2020-08-31 DIAGNOSIS — F10129 Alcohol abuse with intoxication, unspecified: Secondary | ICD-10-CM

## 2020-08-31 DIAGNOSIS — Z79899 Other long term (current) drug therapy: Secondary | ICD-10-CM | POA: Diagnosis not present

## 2020-08-31 LAB — COMPREHENSIVE METABOLIC PANEL
ALT: 17 U/L (ref 0–44)
AST: 21 U/L (ref 15–41)
Albumin: 3.7 g/dL (ref 3.5–5.0)
Alkaline Phosphatase: 65 U/L (ref 38–126)
Anion gap: 7 (ref 5–15)
BUN: 6 mg/dL (ref 6–20)
CO2: 29 mmol/L (ref 22–32)
Calcium: 8.8 mg/dL — ABNORMAL LOW (ref 8.9–10.3)
Chloride: 101 mmol/L (ref 98–111)
Creatinine, Ser: 0.69 mg/dL (ref 0.61–1.24)
GFR, Estimated: >60 mL/min (ref >60)
Glucose, Bld: 90 mg/dL (ref 70–99)
Potassium: 3.6 mmol/L (ref 3.5–5.1)
Sodium: 137 mmol/L (ref 135–145)
Total Bilirubin: 0.5 mg/dL (ref 0.3–1.2)
Total Protein: 6.6 g/dL (ref 6.5–8.1)

## 2020-08-31 LAB — LIPID PANEL
Cholesterol: 213 mg/dL — ABNORMAL HIGH (ref 0–200)
HDL: 56 mg/dL (ref >40)
LDL Cholesterol: 83 mg/dL (ref 0–99)
Total CHOL/HDL Ratio: 3.8 RATIO
Triglycerides: 371 mg/dL — ABNORMAL HIGH (ref <150)
VLDL: 74 mg/dL — ABNORMAL HIGH (ref 0–40)

## 2020-08-31 LAB — CBC WITH DIFFERENTIAL/PLATELET
Abs Immature Granulocytes: 0.06 10*3/uL (ref 0.00–0.07)
Basophils Absolute: 0.1 10*3/uL (ref 0.0–0.1)
Basophils Relative: 1 %
Eosinophils Absolute: 0.1 10*3/uL (ref 0.0–0.5)
Eosinophils Relative: 2 %
HCT: 43.3 % (ref 39.0–52.0)
Hemoglobin: 15.1 g/dL (ref 13.0–17.0)
Immature Granulocytes: 1 %
Lymphocytes Relative: 15 %
Lymphs Abs: 1 10*3/uL (ref 0.7–4.0)
MCH: 31.9 pg (ref 26.0–34.0)
MCHC: 34.9 g/dL (ref 30.0–36.0)
MCV: 91.4 fL (ref 80.0–100.0)
Monocytes Absolute: 0.9 10*3/uL (ref 0.1–1.0)
Monocytes Relative: 14 %
Neutro Abs: 4.3 10*3/uL (ref 1.7–7.7)
Neutrophils Relative %: 67 %
Platelets: 323 10*3/uL (ref 150–400)
RBC: 4.74 MIL/uL (ref 4.22–5.81)
RDW: 14.1 % (ref 11.5–15.5)
WBC: 6.4 10*3/uL (ref 4.0–10.5)
nRBC: 0 % (ref 0.0–0.2)

## 2020-08-31 LAB — RESP PANEL BY RT-PCR (FLU A&B, COVID) ARPGX2
Influenza A by PCR: NEGATIVE
Influenza B by PCR: NEGATIVE
SARS Coronavirus 2 by RT PCR: NEGATIVE

## 2020-08-31 LAB — ETHANOL: Alcohol, Ethyl (B): 62 mg/dL — ABNORMAL HIGH (ref <10)

## 2020-08-31 LAB — TSH: TSH: 0.74 u[IU]/mL (ref 0.350–4.500)

## 2020-08-31 LAB — POC SARS CORONAVIRUS 2 AG: SARSCOV2ONAVIRUS 2 AG: NEGATIVE

## 2020-08-31 MED ORDER — THIAMINE HCL 100 MG PO TABS
100.0000 mg | ORAL_TABLET | Freq: Every day | ORAL | Status: DC
  Administered 2020-09-01 – 2020-09-03 (×3): 100 mg via ORAL
  Filled 2020-08-31 (×3): qty 1

## 2020-08-31 MED ORDER — HYDROXYZINE HCL 25 MG PO TABS
25.0000 mg | ORAL_TABLET | Freq: Four times a day (QID) | ORAL | Status: AC | PRN
  Administered 2020-08-31 – 2020-09-03 (×6): 25 mg via ORAL
  Filled 2020-08-31 (×6): qty 1

## 2020-08-31 MED ORDER — ACETAMINOPHEN 325 MG PO TABS
650.0000 mg | ORAL_TABLET | Freq: Four times a day (QID) | ORAL | Status: DC | PRN
  Administered 2020-09-01 – 2020-09-04 (×4): 650 mg via ORAL
  Filled 2020-08-31 (×3): qty 2

## 2020-08-31 MED ORDER — ALBUTEROL SULFATE HFA 108 (90 BASE) MCG/ACT IN AERS: 2.0000 | INHALATION_SPRAY | Freq: Four times a day (QID) | RESPIRATORY_TRACT | Status: DC | PRN

## 2020-08-31 MED ORDER — THIAMINE HCL 100 MG/ML IJ SOLN
100.0000 mg | Freq: Once | INTRAMUSCULAR | Status: AC
  Administered 2020-08-31: 100 mg via INTRAMUSCULAR
  Filled 2020-08-31: qty 2

## 2020-08-31 MED ORDER — LOPERAMIDE HCL 2 MG PO CAPS: 2.0000 mg | ORAL_CAPSULE | ORAL | Status: AC | PRN

## 2020-08-31 MED ORDER — LORAZEPAM 1 MG PO TABS
1.0000 mg | ORAL_TABLET | Freq: Four times a day (QID) | ORAL | Status: AC | PRN
  Administered 2020-08-31 – 2020-09-01 (×2): 1 mg via ORAL
  Filled 2020-08-31 (×2): qty 1

## 2020-08-31 MED ORDER — NICOTINE 21 MG/24HR TD PT24
21.0000 mg | MEDICATED_PATCH | Freq: Every day | TRANSDERMAL | Status: DC
  Administered 2020-08-31 – 2020-09-04 (×4): 21 mg via TRANSDERMAL
  Filled 2020-08-31 (×5): qty 1

## 2020-08-31 MED ORDER — ADULT MULTIVITAMIN W/MINERALS CH
1.0000 | ORAL_TABLET | Freq: Every day | ORAL | Status: DC
  Administered 2020-08-31 – 2020-09-03 (×4): 1 via ORAL
  Filled 2020-08-31 (×4): qty 1

## 2020-08-31 MED ORDER — ALUM & MAG HYDROXIDE-SIMETH 200-200-20 MG/5ML PO SUSP: 30.0000 mL | ORAL | Status: DC | PRN

## 2020-08-31 MED ORDER — ONDANSETRON 4 MG PO TBDP: 4.0000 mg | ORAL_TABLET | Freq: Four times a day (QID) | ORAL | Status: AC | PRN

## 2020-08-31 MED ORDER — OLANZAPINE 5 MG PO TBDP
5.0000 mg | ORAL_TABLET | Freq: Every day | ORAL | Status: DC
  Administered 2020-08-31 – 2020-09-01 (×3): 5 mg via ORAL
  Filled 2020-08-31 (×3): qty 1

## 2020-08-31 MED ORDER — MAGNESIUM HYDROXIDE 400 MG/5ML PO SUSP: 30.0000 mL | Freq: Every day | ORAL | Status: DC | PRN

## 2020-08-31 NOTE — Discharge Instructions (Signed)
Residential Substance Abuse treatment is recommended.  [facility] and [facility] will likely have treatment beds tomorrow(8/18).  They have asked that you call them after 8am.  [facility] Address: [address] [place], [place] [address] [phone]  [facility] [address] [place], [place] [address]  [phone]

## 2020-08-31 NOTE — ED Provider Notes (Signed)
[facility] Admission Suicide Risk Assessment   Nursing information obtained from:   Current nursing staff Demographic factors:   Caucasian, male, social economic concerns Current Mental Status:   Alert and oriented, cooperative and appropriate Loss Factors:    Historical Factors:   History of substance use disorder, history of previous mental health diagnoses Risk Reduction Factors:   Motivated for treatment, social support  Total Time spent with patient: 20 minutes Principal Problem: <principal problem not specified> Diagnosis:  Active Problems:   Alcohol use disorder, severe, in early remission (HCC)  Subjective Data: Ricky Watson is a 45 year old male.  Patient [facility] to [facility] behavioral health for walk-in assessment.Patient is assessed face-to-face by nurse practitioner. He is seated in assessment area, no acute distress. He is alert and oriented, pleasant and cooperative during assessment. He reports depressed mood with congruent affect. He denies suicidal and homicidal ideations, currently.  He contracts verbally for safety with this Clinical research associate.   He has normal speech and behavior.  He denies both auditory and visual hallucinations, reports last episode of hallucinations approximately 2 weeks ago.  Patient is able to converse coherently with goal-directed thoughts and no distractibility or preoccupation.  He denies paranoia.  Objectively there is no evidence of psychosis/mania or delusional thinking. [name] reports he is seeking substance use treatment related to his chronic alcohol use disorder.  He reports last use of alcohol earlier this date, uses alcohol daily.  He uses alcohol up to 1/2 gallon per day.  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 He reports recent stressors include loss of his job as well as loss of his housing related to alcohol use.  [name] was discharged from sober living [facility] 2 weeks ago after it was  discovered he had continued to use alcohol while in treatment program.  He subsequently lost his employment. He has been diagnosed with schizoaffective disorder as well as alcohol induced mood disorder.  He is not currently followed by outpatient psychiatry.  He has most recently been prescribed Risperdal states "it makes me feel like I am underwater, like I am weighted down."  He reports he has responded well to Zyprexa in the past, discussed side effects, patient agrees with plan to restart Zyprexa.  He would like to follow-up with outpatient psychiatry once substance use treatment completed. He is an everyday smoker who request nicotine patch.  He reports use of albuterol inhaler as needed. Patient offered support and encouragement.   Continued Clinical Symptoms:    The "Alcohol Use Disorders Identification Test", Guidelines for Use in Primary Care, Second Edition.  World [employer] writer [facility]). Score between 0-7:  no or low risk or alcohol related problems. Score between 8-15:  moderate risk of alcohol related problems. Score between 16-19:  high risk of alcohol related problems. Score 20 or above:  warrants further diagnostic evaluation for alcohol dependence and treatment.   CLINICAL FACTORS:   Alcohol/Substance Abuse/Dependencies Previous Psychiatric Diagnoses and Treatments   Musculoskeletal: Strength & Muscle Tone: within normal limits Gait & Station: normal Patient leans: N/A  Psychiatric Specialty Exam:  Presentation  General Appearance: Fairly Groomed  Eye Contact:Fair  Speech:Normal Rate  Speech Volume:Normal  Handedness:Right   Mood and Affect  Mood:Euthymic  Affect:Congruent   Thought Process  Thought Processes:Coherent  Descriptions of Associations:Circumstantial  Orientation:Full (Time, Place and Person)  Thought Content:Logical  History of Schizophrenia/Schizoaffective disorder:Yes  Duration of Psychotic Symptoms:Greater than six  months  Hallucinations:Hallucinations: None  Ideas of Reference:None  Suicidal Thoughts:Suicidal Thoughts: No  Homicidal Thoughts:Homicidal Thoughts: No   Sensorium  Memory:Immediate Fair; Recent Fair; Remote Fair  Judgment:Impaired  Insight:Poor   Executive Functions  Concentration:Fair  Attention Span:Fair  Recall:Fair  Fund of Knowledge:Fair  Language:Fair   Psychomotor Activity  Psychomotor Activity:Psychomotor Activity: Normal   Assets  Assets:Desire for Improvement   Sleep  Sleep:Sleep: Good    Physical Exam: Physical Exam Vitals and nursing note reviewed.  Constitutional:      Appearance: Normal appearance. He is normal weight.  HENT:     Head: Normocephalic and atraumatic.     Nose: Nose normal.  Cardiovascular:     Rate and Rhythm: Normal rate.  Pulmonary:     Effort: Pulmonary effort is normal.  Musculoskeletal:        General: Normal range of motion.     Cervical back: Normal range of motion.  Skin:    General: Skin is warm and dry.  Neurological:     Mental Status: He is alert and oriented to person, place, and time.  Psychiatric:        Attention and Perception: Attention and perception normal.        Mood and Affect: Affect normal. Mood is depressed.        Speech: Speech normal.        Behavior: Behavior normal. Behavior is cooperative.        Thought Content: Thought content normal.        Cognition and Memory: Cognition and memory normal.        Judgment: Judgment normal.   Review of Systems  Constitutional: Negative.   HENT: Negative.    Eyes: Negative.   Respiratory: Negative.    Cardiovascular: Negative.   Gastrointestinal: Negative.   Genitourinary: Negative.   Musculoskeletal: Negative.   Skin: Negative.   Neurological: Negative.   Endo/Heme/Allergies: Negative.   Psychiatric/Behavioral:  Positive for depression and substance abuse.   Blood pressure 125/90, pulse (!) 105, temperature 98.6 F (37 C),  temperature source Oral, resp. rate 16, SpO2 96 %. There is no height or weight on file to calculate BMI.   COGNITIVE FEATURES THAT CONTRIBUTE TO RISK:  None    SUICIDE RISK:   Minimal: No identifiable suicidal ideation.  Patients presenting with no risk factors but with morbid ruminations; may be classified as minimal risk based on the severity of the depressive symptoms  PLAN OF CARE: Patient will be admitted to [facility] behavioral health facility based crisis for treatment and stabilization.  Plan to discharge to residential substance use treatment as available.  Patient reviewed with Dr. Bronwen Betters.  I certify that inpatient services furnished can reasonably be expected to improve the patient's condition.   CIWA/ Ativan protocol initiated. Medications: -Albuterol 108 mcg inhaler 2 puffs every 6 as needed/wheezing, shortness of breath -Hydroxyzine 25 mg every 6 as needed/anxiety -Loperamide capsule 2 to 4 mg oral as needed/diarrhea for 72 hours -Lorazepam 1 mg every 6 as needed CIWA greater than 10 -Multivitamin 1 tablet daily -Nicotine patch 21 mg transdermal daily -Olanzapine Zydis 5 mg nightly/mood -Ondansetron 4 mg every 6 as needed/nausea, vomiting for 72 hours -Thiamine D1 injection 100 mg IM once -Thiamine tablet 100 mg oral daily   Lenard Lance, FNP 08/31/2020, 6:34 PM

## 2020-08-31 NOTE — Progress Notes (Signed)
   08/31/20 1710  [facility] Triage Screening (Walk-ins at [facility] only)  What Is the Reason for Your Visit/Call Today? Patient called police today for transport to [facility], as he is seeking detox and/or residential SA tx.  He was just discharged from [facility] on Saturday and relapsed that evening.  He states treatment at [facility] was not helpful, stating "they tried to call me a liar."  This is likely related to patient's documented hx of malingering at [facility] and now in the [place] system.  Patient is denying SI, HI and AVH.  He has not continued medications, stating they "prescribed the wrong medicine" at [facility].  He states he will resume outpt tx once he completes SA treatment.  He would like to be referred to [facility], however he would like to wait until he completes residential SA treatment.  How Long Has This Been Causing You Problems? <Week  Have You Recently Had Any Thoughts About Hurting Yourself? No  Are You Planning to Commit Suicide/Harm Yourself At This time? No  Have you Recently Had Thoughts About Hurting Someone Karolee Ohs? No  Are You Planning To Harm Someone At This Time? No  Are you currently experiencing any auditory, visual or other hallucinations? No  Have You Used Any Alcohol or Drugs in the Past 24 Hours? Yes  How long ago did you use Drugs or Alcohol? today  What Did You Use and How Much? 8- 25 oz beers  Do you have any current medical co-morbidities that require immediate attention? No  Clinician description of patient physical appearance/behavior: Patient is calm, cooperative, pleasant and requesting help getting into residential SA tx.  What Do You Feel Would Help You the Most Today? Alcohol or Drug Use Treatment  If access to [facility] was not available, would you have sought care in the Emergency Department? No  Determination of Need Routine (7 days)  Options For Referral Other: Comment (Residential SA treatment)
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
## 2020-08-31 NOTE — ED Provider Notes (Addendum)
Behavioral Health Admission H&P [facility] & OBS)  Date: 08/31/20 Patient Name: Ricky Watson MRN: [id number] Chief Complaint: No chief complaint on file.   Alcohol Use Detox  Diagnoses:  Final diagnoses:  Alcohol abuse with intoxication [facility])    HPI:  Patient is a 45 year old male with history of schizoaffective disorder, polysubstance abuse (heroin and alcohol), and malingering last discharged from [facility] on 08-25-2020 presented to the behavioral health urgent care via [facility] asking for assistance in finding substance use rehabilitation programs.  Patient stated that his experience at [facility] was "horrible, they said I was lying and blah blah blah".  Patient stated that he did not receive appropriate treatment at [facility]. Pt has not seeked outpatient treatments since his discharge.  Patient has not been compliant with medications as recommended by [facility].  Patient stated that he had used the remaining of his paycheck that he received from his last employment on alcohol and staying at a hotel.  Patient stated that he has been sober for 5 hours since being at [facility] but has drank 8 25 ounce beers today and had at least half a gallon of liquor yesterday.  Patient stated that since his discharge from [facility] he has been consistently drinking heavily.  Patient stated that he had attempted to contact substance use rehab programs such as [facility] and other programs but "everything is full".  Patient stated that he came to [facility] because he has run out of money and is seeking treatment for his substance use.   TTS had contacted [facility] and [facility] and both have stated that they will have bed availability for detox and rehab tomorrow at 8AM.  Patient was originally to be discharged from [facility] tonight so that he can call these programs tomorrow to be admitted but patient stated that he felt "unsafe" should he go to a homeless shelter.  Patient stated that he is "afraid he will use more alcohol and potentially heroin".  When asked how  patient would acquire heroin or alcohol with no money, patient stated that he was going to "just steal it".  Patient stated that he does not want to go to a homeless shelter because he would definitely use substances there.  Patient stated he wants to first do rehabilitation rather than outpatient psychiatric treatment for "however long it takes" stating that he wants to in the future go back to [place] to be with his "many many children" once he is sober. Of note, pt was originally to be considered for [employer] as he is able to work but he owes them ~$800 when he previously went there.  Patient denies present SI/HI/AVH.  PHQ 2-9:   Flowsheet Row [facility]) [facility] Most recent reading at 08/24/2020  2:45 PM ED from 08/23/2020 in [facility] [place] [facility] Most recent reading at 08/23/2020  9:08 PM ED from 08/23/2020 in [facility] Most recent reading at 08/23/2020  5:59 PM  C-SSRS RISK CATEGORY Moderate Risk High Risk High Risk        Total Time spent with patient: 30 minutes  Musculoskeletal  Strength & Muscle Tone: within normal limits Gait & Station: normal Patient leans: Front  Psychiatric Specialty Exam  Presentation General Appearance: Fairly Groomed  Eye Contact:Fair  Speech:Normal Rate  Speech Volume:Normal  Handedness:Right   Mood and Affect  Mood:Euthymic  Affect:Congruent   Thought Process  Thought Processes:Coherent  Descriptions of Associations:Circumstantial  Orientation:Full (Time, Place  and Person)  Thought Content:Logical  Diagnosis of Schizophrenia or Schizoaffective disorder in past: Yes  Duration of Psychotic Symptoms: Greater than six months  Hallucinations:Hallucinations: None  Ideas of Reference:None  Suicidal Thoughts:Suicidal Thoughts: No  Homicidal Thoughts:Homicidal Thoughts: No   Sensorium   Memory:Immediate Fair; Recent Fair; Remote Fair  Judgment:Impaired  Insight:Poor   Executive Functions  Concentration:Fair  Attention Span:Fair  Recall:Fair  Fund of Knowledge:Fair  Language:Fair   Psychomotor Activity  Psychomotor Activity:Psychomotor Activity: Normal   Assets  Assets:Desire for Improvement   Sleep  Sleep:Sleep: Good   Nutritional Assessment (For OBS and FBC admissions only) Has the patient had a weight loss or gain of 10 pounds or more in the last 3 months?: No Has the patient had a decrease in food intake/or appetite?: No Does the patient have dental problems?: No Does the patient have eating habits or behaviors that may be indicators of an eating disorder including binging or inducing vomiting?: No Has the patient recently lost weight without trying?: No Has the patient been eating poorly because of a decreased appetite?: No Malnutrition Screening Tool Score: 0    Physical Exam Vitals and nursing note reviewed.  Constitutional:      Appearance: He is well-developed.  HENT:     Head: Normocephalic and atraumatic.  Eyes:     Conjunctiva/sclera: Conjunctivae normal.  Cardiovascular:     Rate and Rhythm: Normal rate and regular rhythm.     Heart sounds: No murmur heard. Pulmonary:     Effort: Pulmonary effort is normal. No respiratory distress.     Breath sounds: Normal breath sounds.  Abdominal:     Palpations: Abdomen is soft.     Tenderness: no abdominal tenderness  Musculoskeletal:     Cervical back: Neck supple.  Skin:    General: Skin is warm and dry.  Neurological:     Mental Status: He is alert.   Review of Systems  Constitutional:  Negative for chills and fever.  Respiratory:  Negative for cough, shortness of breath and wheezing.   Cardiovascular:  Negative for chest pain and palpitations.  Gastrointestinal:  Negative for abdominal pain, nausea and vomiting.  Skin:  Negative for itching and rash.  Neurological:   Negative for dizziness and headaches.   Blood pressure 125/90, pulse (!) 105, temperature 98.6 F (37 C), temperature source Oral, resp. rate 16, SpO2 96 %. There is no height or weight on file to calculate BMI.  Past Psychiatric History: Hx of Schizoaffective Disorder, Malingering (several admissions, last admission was at [facility] d/c 08/25/20), polysubstance use (heroin and alcohol)   Is the patient at risk to self? No  Has the patient been a risk to self in the past 6 months? Yes .    Has the patient been a risk to self within the distant past? Yes   Is the patient a risk to others? No   Has the patient been a risk to others in the past 6 months? No   Has the patient been a risk to others within the distant past? No   Past Medical History:  Past Medical History:  Diagnosis Date   Asthma    No past surgical history on file.  Family History: No family history on file.  Social History:  Social History   Socioeconomic History   Marital status: Divorced    Spouse name: Not on file   Number of children: Not on file   Years of education: Not on file   Highest  education level: Not on file  Occupational History   Not on file  Tobacco Use   Smoking status: Every Day    Packs/day: 1.00    Types: Cigarettes   Smokeless tobacco: Current  Substance and Sexual Activity   Alcohol use: Not Currently   Drug use: Not Currently   Sexual activity: Not Currently  Other Topics Concern   Not on file  Social History Narrative   Not on file   Social Determinants of Health   Financial Resource Strain: Not on file  Food Insecurity: Not on file  Transportation Needs: Not on file  Physical Activity: Not on file  Stress: Not on file  Social Connections: Not on file  Intimate Partner Violence: Not on file    SDOH:  SDOH Screenings   Alcohol Screen: Low Risk    Last Alcohol Screening Score (AUDIT): 2  Depression (PHQ2-9): Not on file  Financial Resource Strain: Not on file  Food  Insecurity: Not on file  Housing: Not on file  Physical Activity: Not on file  Social Connections: Not on file  Stress: Not on file  Tobacco Use: High Risk   Smoking Tobacco Use: Every Day   Smokeless Tobacco Use: Current  Transportation Needs: Not on file    Last Labs:  Admission on 08/24/2020, Discharged on 08/26/2020  Component Date Value Ref Range Status   Hgb A1c MFr Bld 08/25/2020 5.8 (A) 4.8 - 5.6 % Final   Comment: (NOTE) Pre diabetes:          5.7%-6.4%  Diabetes:              >6.4%  Glycemic control for   <7.0% adults with diabetes    Mean Plasma Glucose 08/25/2020 119.76  mg/dL Final   Performed at [facility], [address], [place], [place] [address]   Cholesterol 08/25/2020 211 (A) 0 - 200 mg/dL Final   Triglycerides 62/13/0865 71  <150 mg/dL Final   HDL 78/46/9629 73  >40 mg/dL Final   Total CHOL/HDL Ratio 08/25/2020 2.9  RATIO Final   VLDL 08/25/2020 14  0 - 40 mg/dL Final   LDL Cholesterol 08/25/2020 124 (A) 0 - 99 mg/dL Final   Comment:        Total Cholesterol/HDL:CHD Risk Coronary Heart Disease Risk Table                     Men   Women  1/2 Average Risk   3.4   3.3  Average Risk       5.0   4.4  2 X Average Risk   9.6   7.1  3 X Average Risk  23.4   11.0        Use the calculated Patient Ratio above and the CHD Risk Table to determine the patient's CHD Risk.        ATP III CLASSIFICATION (LDL):  <100     mg/dL   Optimal  528-413  mg/dL   Near or Above                    Optimal  130-159  mg/dL   Borderline  244-010  mg/dL   High  >272     mg/dL   Very High Performed at [facility], [address], [place], [place] [address]    TSH 08/25/2020 1.835  0.350 - 4.500 uIU/mL Final   Comment: Performed by a 3rd Generation assay with a  functional sensitivity of <=0.01 uIU/mL. Performed at [facility], [address], [place], [place] [address]   Admission on 08/23/2020, Discharged on 08/24/2020   Component Date Value Ref Range Status   SARS Coronavirus 2 by RT PCR 08/23/2020 NEGATIVE  NEGATIVE Final   Comment: (NOTE) SARS-CoV-2 target nucleic acids are NOT DETECTED.  The SARS-CoV-2 RNA is generally detectable in upper respiratory specimens during the acute phase of infection. The lowest concentration of SARS-CoV-2 viral copies this assay can detect is 138 copies/mL. A negative result does not preclude SARS-Cov-2 infection and should not be used as the sole basis for treatment or other patient management decisions. A negative result may occur with  improper specimen collection/handling, submission of specimen other than nasopharyngeal swab, presence of viral mutation(s) within the areas targeted by this assay, and inadequate number of viral copies(<138 copies/mL). A negative result must be combined with clinical observations, patient history, and epidemiological information. The expected result is Negative.  Fact Sheet for Patients:  BloggerCourse.com  Fact Sheet for Healthcare Providers:  SeriousBroker.it  This test is no                          t yet approved or cleared by the [place] FDA and  has been authorized for detection and/or diagnosis of SARS-CoV-2 by FDA under an Emergency Use Authorization (EUA). This EUA will remain  in effect (meaning this test can be used) for the duration of the COVID-19 declaration under Section 564(b)(1) of the Act, 21 U.S.C.section 360bbb-3(b)(1), unless the authorization is terminated  or revoked sooner.       Influenza A by PCR 08/23/2020 NEGATIVE  NEGATIVE Final   Influenza B by PCR 08/23/2020 NEGATIVE  NEGATIVE Final   Comment: (NOTE) The Xpert Xpress SARS-CoV-2/FLU/RSV plus assay is intended as an aid in the diagnosis of influenza from Nasopharyngeal swab specimens and should not be used as a sole basis for treatment. Nasal washings and aspirates are unacceptable for  Xpert Xpress SARS-CoV-2/FLU/RSV testing.  Fact Sheet for Patients: BloggerCourse.com  Fact Sheet for Healthcare Providers: SeriousBroker.it  This test is not yet approved or cleared by the [place] FDA and has been authorized for detection and/or diagnosis of SARS-CoV-2 by FDA under an Emergency Use Authorization (EUA). This EUA will remain in effect (meaning this test can be used) for the duration of the COVID-19 declaration under Section 564(b)(1) of the Act, 21 U.S.C. section 360bbb-3(b)(1), unless the authorization is terminated or revoked.  Performed at [facility], [address], [place], [place] [address]    Sodium 08/23/2020 139  135 - 145 mmol/L Final   Potassium 08/23/2020 3.8  3.5 - 5.1 mmol/L Final   Chloride 08/23/2020 105  98 - 111 mmol/L Final   CO2 08/23/2020 26  22 - 32 mmol/L Final   Glucose, Bld 08/23/2020 99  70 - 99 mg/dL Final   Glucose reference range applies only to samples taken after fasting for at least 8 hours.   BUN 08/23/2020 8  6 - 20 mg/dL Final   Creatinine, Ser 08/23/2020 0.69  0.61 - 1.24 mg/dL Final   Calcium 23/53/6144 8.5 (A) 8.9 - 10.3 mg/dL Final   Total Protein 31/54/0086 7.2  6.5 - 8.1 g/dL Final   Albumin 76/19/5093 3.9  3.5 - 5.0 g/dL Final   AST 26/71/2458 38  15 - 41 U/L Final   ALT 08/23/2020 34  0 - 44 U/L Final  Alkaline Phosphatase 08/23/2020 82  38 - 126 U/L Final   Total Bilirubin 08/23/2020 0.5  0.3 - 1.2 mg/dL Final   GFR, Estimated 08/23/2020 >60  >60 mL/min Final   Comment: (NOTE) Calculated using the CKD-EPI Creatinine Equation (2021)    Anion gap 08/23/2020 8  5 - 15 Final   Performed at [facility], [address] [place], [place] [address]   Alcohol, Ethyl (B) 08/23/2020 33 (A) <10 mg/dL Final   Comment: (NOTE) Lowest detectable limit for serum alcohol is 10 mg/dL.  For medical purposes only. Performed at [facility], [address] [place], [place] [address]    Opiates 08/23/2020 NONE DETECTED  NONE DETECTED Final   Cocaine 08/23/2020 NONE DETECTED  NONE DETECTED Final   Benzodiazepines 08/23/2020 NONE DETECTED  NONE DETECTED Final   Amphetamines 08/23/2020 NONE DETECTED  NONE DETECTED Final   Tetrahydrocannabinol 08/23/2020 NONE DETECTED  NONE DETECTED Final   Barbiturates 08/23/2020 NONE DETECTED  NONE DETECTED Final   Comment: (NOTE) DRUG SCREEN FOR MEDICAL PURPOSES ONLY.  IF CONFIRMATION IS NEEDED FOR ANY PURPOSE, NOTIFY LAB WITHIN 5 DAYS.  LOWEST DETECTABLE LIMITS FOR URINE DRUG SCREEN Drug Class                     Cutoff (ng/mL) Amphetamine and metabolites    1000 Barbiturate and metabolites    200 Benzodiazepine                 200 Tricyclics and metabolites     300 Opiates and metabolites        300 Cocaine and metabolites        300 THC                            50 Performed at [facility], [address] [place], [place] [address]    WBC 08/23/2020 6.2  4.0 - 10.5 K/uL Final   RBC 08/23/2020 5.10  4.22 - 5.81 MIL/uL Final   Hemoglobin 08/23/2020 15.9  13.0 - 17.0 g/dL Final   HCT 84/69/629508/10/2020 46.5  39.0 - 52.0 % Final   MCV 08/23/2020 91.2  80.0 - 100.0 fL Final   MCH 08/23/2020 31.2  26.0 - 34.0 pg Final   MCHC 08/23/2020 34.2  30.0 - 36.0 g/dL Final   RDW 28/41/324408/10/2020 13.7  11.5 - 15.5 % Final   Platelets 08/23/2020 294  150 - 400 K/uL Final   nRBC 08/23/2020 0.0  0.0 - 0.2 % Final   Neutrophils Relative % 08/23/2020 65  % Final   Neutro Abs 08/23/2020 4.0  1.7 - 7.7 K/uL Final   Lymphocytes Relative 08/23/2020 18  % Final   Lymphs Abs 08/23/2020 1.1  0.7 - 4.0 K/uL Final   Monocytes Relative 08/23/2020 12  % Final   Monocytes Absolute 08/23/2020 0.8  0.1 - 1.0 K/uL Final   Eosinophils Relative 08/23/2020 3  % Final   Eosinophils Absolute 08/23/2020 0.2  0.0 - 0.5 K/uL Final   Basophils Relative 08/23/2020 1  % Final   Basophils  Absolute 08/23/2020 0.1  0.0 - 0.1 K/uL Final   Immature Granulocytes 08/23/2020 1  % Final   Abs Immature Granulocytes 08/23/2020 0.05  0.00 - 0.07 K/uL Final   Performed at [facility], [address] [place], [place] [address]  Admission on 08/01/2020, Discharged on 08/01/2020  Component Date Value Ref  Range Status   INFLUENZA A ANTIGEN, POC 08/01/2020 NEGATIVE  NEGATIVE Final   INFLUENZA B ANTIGEN, POC 08/01/2020 NEGATIVE  NEGATIVE Final    Allergies: Codeine, Diphenhydramine hcl, Diphenhydramine-zinc acetate, Penicillins, and Zolpidem  PTA Medications: (Not in a hospital admission)   Medical Decision Making  Patient to be admitted to [facility] for temporary substance use stabilization and to be transferred to [facility] or [facility] for detox and rehab program. Pt agreeable to this plan.     Recommendations  Based on my evaluation the patient does not appear to have an emergency medical condition.  Park Pope, MD 08/31/20  6:21 PM

## 2020-08-31 NOTE — ED Notes (Signed)
Pt presents requesting Detox for alcohol abuse, pt reports he drinks alcohol every day.  Denies SI, HI or AVH.  Pt A&O x 4, no acute distress noted, calm & cooperative.  Skin search completed, monitoring for safety.

## 2020-09-01 ENCOUNTER — Encounter (HOSPITAL_COMMUNITY): Payer: Self-pay | Admitting: Student

## 2020-09-01 DIAGNOSIS — Z20822 Contact with and (suspected) exposure to covid-19: Secondary | ICD-10-CM | POA: Diagnosis not present

## 2020-09-01 DIAGNOSIS — F259 Schizoaffective disorder, unspecified: Secondary | ICD-10-CM | POA: Diagnosis not present

## 2020-09-01 DIAGNOSIS — F1020 Alcohol dependence, uncomplicated: Secondary | ICD-10-CM | POA: Diagnosis not present

## 2020-09-01 DIAGNOSIS — Z79899 Other long term (current) drug therapy: Secondary | ICD-10-CM | POA: Diagnosis not present

## 2020-09-01 NOTE — Clinical Social Work Psych Note (Addendum)
CSW Update  Patient has been accepted to [facility] for a screening appointment for residential treatment. Screening appointment is Tuesday, 09/05/20 at 9:00am.   Patient will need a 14 day supply of medications, with a 30-day prescription (including 1 refill).   CSW spoke with Berneice Heinrich, NP. NP was agreeable with the patient's tentative discharge plans.   CSW still has not heard from or received any updates regarding the patient's referral to [facility].    CSW will continue to follow.     Baldo Daub, MSW, LCSW Clinical Child psychotherapist (Facility Based Crisis) [facility]

## 2020-09-01 NOTE — Group Therapy Note (Signed)
Patient interacted in wrap up group 100 percent and gave positive feedback.

## 2020-09-01 NOTE — Progress Notes (Addendum)
Patient received ativan 1MG  due to CIWA score of 9. Patient experiencing hand tremors, headache, and sweating. Nursing staff will continue to monitor. Patient refused nicotine patch for now, states he will get it later.

## 2020-09-01 NOTE — ED Notes (Signed)
Remains asleep no distress noted q-15 min checks continued condition stable

## 2020-09-01 NOTE — ED Notes (Signed)
Snack given.

## 2020-09-01 NOTE — Progress Notes (Signed)
Patient resting in bed, respirations are even and unlabored at this time. Nursing staff will continue to monitor.

## 2020-09-01 NOTE — ED Provider Notes (Addendum)
Behavioral Health Progress Note  Date and Time: 09/01/2020 10:22 AM Name: Ricky Watson MRN:  [id number]  Subjective: Patient states "so far it is going good, I am looking forward to working again and staying clean after substance treatment."  Patient is reassessed face-to-face by nurse practitioner, chart reviewed.  Chart findings discussed with the treatment team.     Patient is seated, no acute distress. He is alert and oriented, pleasant and cooperative during assessment.  He reports euthymic mood with congruent affect.  He continues to deny suicidal and homicidal ideations.  He contracts verbally for safety with this Clinical research associate.   He has clear and coherent speech average volume.  Behavior calm and appropriate, with good eye contact.  He denies both auditory and visual hallucinations.  There is no indication that he is responding to internal stimuli, no evidence of delusional thought content.  Patient is able to converse coherently with goal-directed thoughts and no distractibility or preoccupation. He denies paranoia.  Objectively there is no evidence of psychosis/mania or delusional thinking. Patient is insightful regarding admission, he reports he left a 32-month residential substance use treatment in [place], [facility], approximately 2 months ago.  He reports he was just a few days from completion of the program when he relapsed.  Discussed feelings and treatment progress.  He is forward thinking and goal oriented. Patient is tolerating medications with no adverse effects/reactions per his report. Patient reports eating without difficulty, average appetite.  He reports average sleep.  Will is visible on unit, attending groups and meals.  Patient offered support and encouragement.    Diagnosis:  Final diagnoses:  Alcohol abuse with intoxication (HCC)    Total Time spent with patient: 30 minutes  Past Psychiatric History: Schizoaffective disorder, alcohol induced mood disorder, alcohol  use disorder Past Medical History:  Past Medical History:  Diagnosis Date   Asthma    No past surgical history on file. Family History: No family history on file. Family Psychiatric  History: None reported Social History:  Social History   Substance and Sexual Activity  Alcohol Use Not Currently     Social History   Substance and Sexual Activity  Drug Use Not Currently    Social History   Socioeconomic History   Marital status: Divorced    Spouse name: Not on file   Number of children: Not on file   Years of education: Not on file   Highest education level: Not on file  Occupational History   Not on file  Tobacco Use   Smoking status: Every Day    Packs/day: 1.00    Types: Cigarettes   Smokeless tobacco: Current  Substance and Sexual Activity   Alcohol use: Not Currently   Drug use: Not Currently   Sexual activity: Not Currently  Other Topics Concern   Not on file  Social History Narrative   Not on file   Social Determinants of Health   Financial Resource Strain: Not on file  Food Insecurity: Not on file  Transportation Needs: Not on file  Physical Activity: Not on file  Stress: Not on file  Social Connections: Not on file   SDOH:  SDOH Screenings   Alcohol Screen: Low Risk    Last Alcohol Screening Score (AUDIT): 2  Depression (PHQ2-9): Low Risk    PHQ-2 Score: 4  Financial Resource Strain: Not on file  Food Insecurity: Not on file  Housing: Not on file  Physical Activity: Not on file  Social Connections: Not on  file  Stress: Not on file  Tobacco Use: High Risk   Smoking Tobacco Use: Every Day   Smokeless Tobacco Use: Current  Transportation Needs: Not on file   Additional Social History:    Pain Medications: see MAR Prescriptions: see MAR Over the Counter: see MAR History of alcohol / drug use?: Yes (Drank 1 small bottle of wine today - sober 9 mos prior to today - 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.) Longest period of  sobriety (when/how long): 2 years Negative Consequences of Use: Personal relationships Withdrawal Symptoms: Patient aware of relationship between substance abuse and physical/medical complications Name of Substance 1: ETOH 1 - Age of First Use: teen 1 - Amount (size/oz): varies 1 - Frequency: daily 1 - Duration: since d/c from [facility] on 08/26/20 - 4-5 days 1 - Last Use / Amount: today, states he drank 8-25 oz beers 1 - Method of Aquiring: used last pay check 1- Route of Use: NA                  Sleep: Good  Appetite:  Good  Current Medications:  Current Facility-Administered Medications  Medication Dose Route Frequency Provider Last Rate Last Admin   acetaminophen (TYLENOL) tablet 650 mg  650 mg Oral Q6H PRN Park Pope, MD       albuterol (VENTOLIN HFA) 108 (90 Base) MCG/ACT inhaler 2 puff  2 puff Inhalation Q6H PRN Lenard Lance, FNP       alum & mag hydroxide-simeth (MAALOX/MYLANTA) 200-200-20 MG/5ML suspension 30 mL  30 mL Oral Q4H PRN Park Pope, MD       hydrOXYzine (ATARAX/VISTARIL) tablet 25 mg  25 mg Oral Q6H PRN Lenard Lance, FNP   25 mg at 08/31/20 1933   loperamide (IMODIUM) capsule 2-4 mg  2-4 mg Oral PRN Lenard Lance, FNP       LORazepam (ATIVAN) tablet 1 mg  1 mg Oral Q6H PRN Lenard Lance, FNP   1 mg at 09/01/20 1003   magnesium hydroxide (MILK OF MAGNESIA) suspension 30 mL  30 mL Oral Daily PRN Park Pope, MD       multivitamin with minerals tablet 1 tablet  1 tablet Oral Daily Lenard Lance, FNP   1 tablet at 09/01/20 1001   nicotine (NICODERM CQ - dosed in mg/24 hours) patch 21 mg  21 mg Transdermal Daily Lenard Lance, FNP   21 mg at 08/31/20 1933   OLANZapine zydis (ZYPREXA) disintegrating tablet 5 mg  5 mg Oral QHS Lenard Lance, FNP   5 mg at 08/31/20 2120   ondansetron (ZOFRAN-ODT) disintegrating tablet 4 mg  4 mg Oral Q6H PRN Lenard Lance, FNP       thiamine tablet 100 mg  100 mg Oral Daily Lenard Lance, FNP   100 mg at 09/01/20 1001   Current  Outpatient Medications  Medication Sig Dispense Refill   albuterol (VENTOLIN HFA) 108 (90 Base) MCG/ACT inhaler Inhale 2 puffs into the lungs every 6 (six) hours as needed for wheezing or shortness of breath. 1 each 0   Aspirin-Acetaminophen-Caffeine (GOODY HEADACHE PO) Take 1 packet by mouth every 6 (six) hours as needed (For headache or back pain.).     risperiDONE (RISPERDAL) 0.5 MG tablet Take 1 tablet (0.5 mg total) by mouth 2 (two) times daily. 60 tablet 0    Labs  Lab Results:  Admission on 08/31/2020  Component Date Value Ref Range Status  SARS Coronavirus 2 by RT PCR 08/31/2020 NEGATIVE  NEGATIVE Final   Comment: (NOTE) SARS-CoV-2 target nucleic acids are NOT DETECTED.  The SARS-CoV-2 RNA is generally detectable in upper respiratory specimens during the acute phase of infection. The lowest concentration of SARS-CoV-2 viral copies this assay can detect is 138 copies/mL. A negative result does not preclude SARS-Cov-2 infection and should not be used as the sole basis for treatment or other patient management decisions. A negative result may occur with  improper specimen collection/handling, submission of specimen other than nasopharyngeal swab, presence of viral mutation(s) within the areas targeted by this assay, and inadequate number of viral copies(<138 copies/mL). A negative result must be combined with clinical observations, patient history, and epidemiological information. The expected result is Negative.  Fact Sheet for Patients:  BloggerCourse.com  Fact Sheet for Healthcare Providers:  SeriousBroker.it  This test is no                          t yet approved or cleared by the [place] FDA and  has been authorized for detection and/or diagnosis of SARS-CoV-2 by FDA under an Emergency Use Authorization (EUA). This EUA will remain  in effect (meaning this test can be used) for the duration of the COVID-19  declaration under Section 564(b)(1) of the Act, 21 U.S.C.section 360bbb-3(b)(1), unless the authorization is terminated  or revoked sooner.       Influenza A by PCR 08/31/2020 NEGATIVE  NEGATIVE Final   Influenza B by PCR 08/31/2020 NEGATIVE  NEGATIVE Final   Comment: (NOTE) The Xpert Xpress SARS-CoV-2/FLU/RSV plus assay is intended as an aid in the diagnosis of influenza from Nasopharyngeal swab specimens and should not be used as a sole basis for treatment. Nasal washings and aspirates are unacceptable for Xpert Xpress SARS-CoV-2/FLU/RSV testing.  Fact Sheet for Patients: BloggerCourse.com  Fact Sheet for Healthcare Providers: SeriousBroker.it  This test is not yet approved or cleared by the [place] FDA and has been authorized for detection and/or diagnosis of SARS-CoV-2 by FDA under an Emergency Use Authorization (EUA). This EUA will remain in effect (meaning this test can be used) for the duration of the COVID-19 declaration under Section 564(b)(1) of the Act, 21 U.S.C. section 360bbb-3(b)(1), unless the authorization is terminated or revoked.  Performed at [facility], [address] [place], [place] [address]    WBC 08/31/2020 6.4  4.0 - 10.5 K/uL Final   RBC 08/31/2020 4.74  4.22 - 5.81 MIL/uL Final   Hemoglobin 08/31/2020 15.1  13.0 - 17.0 g/dL Final   HCT 56/21/3086 43.3  39.0 - 52.0 % Final   MCV 08/31/2020 91.4  80.0 - 100.0 fL Final   MCH 08/31/2020 31.9  26.0 - 34.0 pg Final   MCHC 08/31/2020 34.9  30.0 - 36.0 g/dL Final   RDW 57/84/6962 14.1  11.5 - 15.5 % Final   Platelets 08/31/2020 323  150 - 400 K/uL Final   nRBC 08/31/2020 0.0  0.0 - 0.2 % Final   Neutrophils Relative % 08/31/2020 67  % Final   Neutro Abs 08/31/2020 4.3  1.7 - 7.7 K/uL Final   Lymphocytes Relative 08/31/2020 15  % Final   Lymphs Abs 08/31/2020 1.0  0.7 - 4.0 K/uL Final   Monocytes Relative 08/31/2020 14  % Final    Monocytes Absolute 08/31/2020 0.9  0.1 - 1.0 K/uL Final   Eosinophils Relative 08/31/2020 2  % Final   Eosinophils Absolute  08/31/2020 0.1  0.0 - 0.5 K/uL Final   Basophils Relative 08/31/2020 1  % Final   Basophils Absolute 08/31/2020 0.1  0.0 - 0.1 K/uL Final   Immature Granulocytes 08/31/2020 1  % Final   Abs Immature Granulocytes 08/31/2020 0.06  0.00 - 0.07 K/uL Final   Performed at [facility], [address] [place], [place] [address]   Sodium 08/31/2020 137  135 - 145 mmol/L Final   Potassium 08/31/2020 3.6  3.5 - 5.1 mmol/L Final   Chloride 08/31/2020 101  98 - 111 mmol/L Final   CO2 08/31/2020 29  22 - 32 mmol/L Final   Glucose, Bld 08/31/2020 90  70 - 99 mg/dL Final   Glucose reference range applies only to samples taken after fasting for at least 8 hours.   BUN 08/31/2020 6  6 - 20 mg/dL Final   Creatinine, Ser 08/31/2020 0.69  0.61 - 1.24 mg/dL Final   Calcium 60/45/4098 8.8 (A) 8.9 - 10.3 mg/dL Final   Total Protein 11/91/4782 6.6  6.5 - 8.1 g/dL Final   Albumin 95/62/1308 3.7  3.5 - 5.0 g/dL Final   AST 65/78/4696 21  15 - 41 U/L Final   ALT 08/31/2020 17  0 - 44 U/L Final   Alkaline Phosphatase 08/31/2020 65  38 - 126 U/L Final   Total Bilirubin 08/31/2020 0.5  0.3 - 1.2 mg/dL Final   GFR, Estimated 08/31/2020 >60  >60 mL/min Final   Comment: (NOTE) Calculated using the CKD-EPI Creatinine Equation (2021)    Anion gap 08/31/2020 7  5 - 15 Final   Performed at [facility], [address], [place], [place] [address]   Alcohol, Ethyl (B) 08/31/2020 62 (A) <10 mg/dL Final   Comment: (NOTE) Lowest detectable limit for serum alcohol is 10 mg/dL.  For medical purposes only. Performed at [facility], [address] [place], [place] [address]    Cholesterol 08/31/2020 213 (A) 0 - 200 mg/dL Final   Triglycerides 40/10/2723 371 (A) <150 mg/dL Final   HDL 36/64/4034 56  >40 mg/dL Final   Total CHOL/HDL Ratio 08/31/2020 3.8  RATIO Final   VLDL  08/31/2020 74 (A) 0 - 40 mg/dL Final   LDL Cholesterol 08/31/2020 83  0 - 99 mg/dL Final   Comment:        Total Cholesterol/HDL:CHD Risk Coronary Heart Disease Risk Table                     Men   Women  1/2 Average Risk   3.4   3.3  Average Risk       5.0   4.4  2 X Average Risk   9.6   7.1  3 X Average Risk  23.4   11.0        Use the calculated Patient Ratio above and the CHD Risk Table to determine the patient's CHD Risk.        ATP III CLASSIFICATION (LDL):  <100     mg/dL   Optimal  742-595  mg/dL   Near or Above                    Optimal  130-159  mg/dL   Borderline  638-756  mg/dL   High  >433     mg/dL   Very High Performed at [facility], [address], [place], [place] [address]    TSH 08/31/2020 0.740  0.350 - 4.500 uIU/mL Final   Comment: Performed by a 3rd Generation assay with a functional sensitivity of <=0.01 uIU/mL. Performed at [facility], [address] [place], [place] [address]    SARSCOV2ONAVIRUS 2 AG 08/31/2020 NEGATIVE  NEGATIVE Final   Comment: (NOTE) SARS-CoV-2 antigen NOT DETECTED.   Negative results are presumptive.  Negative results do not preclude SARS-CoV-2 infection and should not be used as the sole basis for treatment or other patient management decisions, including infection  control decisions, particularly in the presence of clinical signs and  symptoms consistent with COVID-19, or in those who have been in contact with the virus.  Negative results must be combined with clinical observations, patient history, and epidemiological information. The expected result is Negative.  Fact Sheet for Patients: https://www.jennings-kim.com/https://www.fda.gov/media/141569/download  Fact Sheet for Healthcare Providers: https://alexander-rogers.biz/https://www.fda.gov/media/141568/download  This test is not yet approved or cleared by the [place] FDA and  has been authorized for detection and/or diagnosis of SARS-CoV-2 by FDA under an Emergency Use Authorization (EUA).  This EUA  will remain in effect (meaning this test can be used) for the duration of  the COV                          ID-19 declaration under Section 564(b)(1) of the Act, 21 U.S.C. section 360bbb-3(b)(1), unless the authorization is terminated or revoked sooner.    Admission on 08/24/2020, Discharged on 08/26/2020  Component Date Value Ref Range Status   Hgb A1c MFr Bld 08/25/2020 5.8 (A) 4.8 - 5.6 % Final   Comment: (NOTE) Pre diabetes:          5.7%-6.4%  Diabetes:              >6.4%  Glycemic control for   <7.0% adults with diabetes    Mean Plasma Glucose 08/25/2020 119.76  mg/dL Final   Performed at [facility], [address] [place], [place] [address]   Cholesterol 08/25/2020 211 (A) 0 - 200 mg/dL Final   Triglycerides 09/81/191408/12/2020 71  <150 mg/dL Final   HDL 78/29/562108/12/2020 73  >40 mg/dL Final   Total CHOL/HDL Ratio 08/25/2020 2.9  RATIO Final   VLDL 08/25/2020 14  0 - 40 mg/dL Final   LDL Cholesterol 08/25/2020 124 (A) 0 - 99 mg/dL Final   Comment:        Total Cholesterol/HDL:CHD Risk Coronary Heart Disease Risk Table                     Men   Women  1/2 Average Risk   3.4   3.3  Average Risk       5.0   4.4  2 X Average Risk   9.6   7.1  3 X Average Risk  23.4   11.0        Use the calculated Patient Ratio above and the CHD Risk Table to determine the patient's CHD Risk.        ATP III CLASSIFICATION (LDL):  <100     mg/dL   Optimal  [id number]  mg/dL   Near or Above                    Optimal  130-159  mg/dL   Borderline  [id number]  mg/dL   High  >952>190     mg/dL   Very High Performed at [facility], [address] [place], [place]  [address]    TSH 08/25/2020 1.835  0.350 - 4.500 uIU/mL Final   Comment: Performed by a 3rd Generation assay with a functional sensitivity of <=0.01 uIU/mL. Performed at [facility], [address], [place], [place] [address]   Admission on 08/23/2020, Discharged on 08/24/2020  Component Date Value  Ref Range Status   SARS Coronavirus 2 by RT PCR 08/23/2020 NEGATIVE  NEGATIVE Final   Comment: (NOTE) SARS-CoV-2 target nucleic acids are NOT DETECTED.  The SARS-CoV-2 RNA is generally detectable in upper respiratory specimens during the acute phase of infection. The lowest concentration of SARS-CoV-2 viral copies this assay can detect is 138 copies/mL. A negative result does not preclude SARS-Cov-2 infection and should not be used as the sole basis for treatment or other patient management decisions. A negative result may occur with  improper specimen collection/handling, submission of specimen other than nasopharyngeal swab, presence of viral mutation(s) within the areas targeted by this assay, and inadequate number of viral copies(<138 copies/mL). A negative result must be combined with clinical observations, patient history, and epidemiological information. The expected result is Negative.  Fact Sheet for Patients:  BloggerCourse.com  Fact Sheet for Healthcare Providers:  SeriousBroker.it  This test is no                          t yet approved or cleared by the [place] FDA and  has been authorized for detection and/or diagnosis of SARS-CoV-2 by FDA under an Emergency Use Authorization (EUA). This EUA will remain  in effect (meaning this test can be used) for the duration of the COVID-19 declaration under Section 564(b)(1) of the Act, 21 U.S.C.section 360bbb-3(b)(1), unless the authorization is terminated  or revoked sooner.       Influenza A by PCR 08/23/2020 NEGATIVE  NEGATIVE Final   Influenza B by PCR 08/23/2020 NEGATIVE  NEGATIVE Final   Comment: (NOTE) The Xpert Xpress SARS-CoV-2/FLU/RSV plus assay is intended as an aid in the diagnosis of influenza from Nasopharyngeal swab specimens and should not be used as a sole basis for treatment. Nasal washings and aspirates are unacceptable for Xpert Xpress  SARS-CoV-2/FLU/RSV testing.  Fact Sheet for Patients: BloggerCourse.com  Fact Sheet for Healthcare Providers: SeriousBroker.it  This test is not yet approved or cleared by the [place] FDA and has been authorized for detection and/or diagnosis of SARS-CoV-2 by FDA under an Emergency Use Authorization (EUA). This EUA will remain in effect (meaning this test can be used) for the duration of the COVID-19 declaration under Section 564(b)(1) of the Act, 21 U.S.C. section 360bbb-3(b)(1), unless the authorization is terminated or revoked.  Performed at [facility], [address], [place], [place] [address]    Sodium 08/23/2020 139  135 - 145 mmol/L Final   Potassium 08/23/2020 3.8  3.5 - 5.1 mmol/L Final   Chloride 08/23/2020 105  98 - 111 mmol/L Final   CO2 08/23/2020 26  22 - 32 mmol/L Final   Glucose, Bld 08/23/2020 99  70 - 99 mg/dL Final   Glucose reference range applies only to samples taken after fasting for at least 8 hours.   BUN 08/23/2020 8  6 - 20 mg/dL Final   Creatinine, Ser 08/23/2020 0.69  0.61 - 1.24 mg/dL Final   Calcium 65/78/4696 8.5 (A) 8.9 - 10.3 mg/dL Final   Total Protein 29/52/8413 7.2  6.5 - 8.1 g/dL Final   Albumin 24/40/1027 3.9  3.5 - 5.0  g/dL Final   AST 16/10/9602 38  15 - 41 U/L Final   ALT 08/23/2020 34  0 - 44 U/L Final   Alkaline Phosphatase 08/23/2020 82  38 - 126 U/L Final   Total Bilirubin 08/23/2020 0.5  0.3 - 1.2 mg/dL Final   GFR, Estimated 08/23/2020 >60  >60 mL/min Final   Comment: (NOTE) Calculated using the CKD-EPI Creatinine Equation (2021)    Anion gap 08/23/2020 8  5 - 15 Final   Performed at [facility], [address] [place], [place] [address]   Alcohol, Ethyl (B) 08/23/2020 33 (A) <10 mg/dL Final   Comment: (NOTE) Lowest detectable limit for serum alcohol is 10 mg/dL.  For medical purposes only. Performed at [facility], [address], [place], [place] [address]    Opiates 08/23/2020 NONE DETECTED  NONE DETECTED Final   Cocaine 08/23/2020 NONE DETECTED  NONE DETECTED Final   Benzodiazepines 08/23/2020 NONE DETECTED  NONE DETECTED Final   Amphetamines 08/23/2020 NONE DETECTED  NONE DETECTED Final   Tetrahydrocannabinol 08/23/2020 NONE DETECTED  NONE DETECTED Final   Barbiturates 08/23/2020 NONE DETECTED  NONE DETECTED Final   Comment: (NOTE) DRUG SCREEN FOR MEDICAL PURPOSES ONLY.  IF CONFIRMATION IS NEEDED FOR ANY PURPOSE, NOTIFY LAB WITHIN 5 DAYS.  LOWEST DETECTABLE LIMITS FOR URINE DRUG SCREEN Drug Class                     Cutoff (ng/mL) Amphetamine and metabolites    1000 Barbiturate and metabolites    200 Benzodiazepine                 200 Tricyclics and metabolites     300 Opiates and metabolites        300 Cocaine and metabolites        300 THC                            50 Performed at [facility], [address], [place], [place] [address]    WBC 08/23/2020 6.2  4.0 - 10.5 K/uL Final   RBC 08/23/2020 5.10  4.22 - 5.81 MIL/uL Final   Hemoglobin 08/23/2020 15.9  13.0 - 17.0 g/dL Final   HCT 62/13/0865 46.5  39.0 - 52.0 % Final   MCV 08/23/2020 91.2  80.0 - 100.0 fL Final   MCH 08/23/2020 31.2  26.0 - 34.0 pg Final   MCHC 08/23/2020 34.2  30.0 - 36.0 g/dL Final   RDW 78/46/9629 13.7  11.5 - 15.5 % Final   Platelets 08/23/2020 294  150 - 400 K/uL Final   nRBC 08/23/2020 0.0  0.0 - 0.2 % Final   Neutrophils Relative % 08/23/2020 65  % Final   Neutro Abs 08/23/2020 4.0  1.7 - 7.7 K/uL Final   Lymphocytes Relative 08/23/2020 18  % Final   Lymphs Abs 08/23/2020 1.1  0.7 - 4.0 K/uL Final   Monocytes Relative 08/23/2020 12  % Final   Monocytes Absolute 08/23/2020 0.8  0.1 - 1.0 K/uL Final   Eosinophils Relative 08/23/2020 3  % Final   Eosinophils Absolute 08/23/2020 0.2  0.0 - 0.5 K/uL Final   Basophils Relative 08/23/2020 1  % Final   Basophils Absolute  08/23/2020 0.1  0.0 - 0.1 K/uL Final   Immature Granulocytes 08/23/2020 1  % Final   Abs Immature Granulocytes 08/23/2020 0.05  0.00 - 0.07 K/uL Final  Performed at [facility], [address] [place], [place] [address]  Admission on 08/01/2020, Discharged on 08/01/2020  Component Date Value Ref Range Status   INFLUENZA A ANTIGEN, POC 08/01/2020 NEGATIVE  NEGATIVE Final   INFLUENZA B ANTIGEN, POC 08/01/2020 NEGATIVE  NEGATIVE Final    Blood Alcohol level:  Lab Results  Component Value Date   ETH 62 (H) 08/31/2020   ETH 33 (H) 08/23/2020    Metabolic Disorder Labs: Lab Results  Component Value Date   HGBA1C 5.8 (H) 08/25/2020   MPG 119.76 08/25/2020   No results found for: PROLACTIN Lab Results  Component Value Date   CHOL 213 (H) 08/31/2020   TRIG 371 (H) 08/31/2020   HDL 56 08/31/2020   CHOLHDL 3.8 08/31/2020   VLDL 74 (H) 08/31/2020   LDLCALC 83 08/31/2020   LDLCALC 124 (H) 08/25/2020    Therapeutic Lab Levels: No results found for: LITHIUM No results found for: VALPROATE No components found for:  CBMZ  Physical Findings   AIMS    Flowsheet Row Admission (Discharged) from OP Visit from 04/10/2019 in [facility]  AIMS Total Score 0      AUDIT    Flowsheet Row [facility] Admission (Discharged) from OP Visit from 04/10/2019 in [facility]  Alcohol Use Disorder Identification Test Final Score (AUDIT) 2 23      PHQ2-9    Flowsheet Row ED from 08/31/2020 in [facility]  PHQ-2 Total Score 2  PHQ-9 Total Score 4      Flowsheet Row ED from 08/31/2020 in [facility] Admission (Discharged) from 08/24/2020 [facility] ED from 08/23/2020 in [facility]  C-SSRS RISK CATEGORY Error: Question 6 not  populated Moderate Risk High Risk        Musculoskeletal  Strength & Muscle Tone: within normal limits Gait & Station: normal Patient leans: N/A  Psychiatric Specialty Exam  Presentation  General Appearance: Appropriate for Environment; Casual  Eye Contact:Good  Speech:Clear and Coherent; Normal Rate  Speech Volume:Normal  Handedness:Right   Mood and Affect  Mood:Euthymic  Affect:Appropriate; Congruent   Thought Process  Thought Processes:Coherent; Goal Directed  Descriptions of Associations:Intact  Orientation:Full (Time, Place and Person)  Thought Content:Logical; WDL  Diagnosis of Schizophrenia or Schizoaffective disorder in past: Yes  Duration of Psychotic Symptoms: Greater than six months   Hallucinations:Hallucinations: None  Ideas of Reference:None  Suicidal Thoughts:Suicidal Thoughts: No  Homicidal Thoughts:Homicidal Thoughts: No   Sensorium  Memory:Immediate Good; Recent Good; Remote Good  Judgment:Fair  Insight:Fair   Executive Functions  Concentration:Good  Attention Span:Good  Recall:Good  Fund of Knowledge:Good  Language:Good   Psychomotor Activity  Psychomotor Activity:Psychomotor Activity: Normal   Assets  Assets:Communication Skills; Leisure Time; Physical Health; Desire for Improvement; Resilience; Social Support   Sleep  Sleep:Sleep: Good   Nutritional Assessment (For [facility] and [facility] admissions only) Has the patient had a weight loss or gain of 10 pounds or more in the last 3 months?: No Has the patient had a decrease in food intake/or appetite?: No Does the patient have dental problems?: No Does the patient have eating habits or behaviors that may be indicators of an eating disorder including binging or inducing vomiting?: No Has the patient recently lost weight without trying?: No Has the patient been eating poorly because of a decreased appetite?: No Malnutrition Screening Tool  Score: 0    Physical Exam   Physical Exam Vitals and nursing note reviewed.  Constitutional:      Appearance: Normal appearance. He is well-developed and normal weight.  HENT:     Head: Normocephalic and atraumatic.     Nose: Nose normal.  Cardiovascular:     Rate and Rhythm: Normal rate.  Pulmonary:     Effort: Pulmonary effort is normal.  Musculoskeletal:        General: Normal range of motion.     Cervical back: Normal range of motion.  Skin:    General: Skin is warm and dry.  Neurological:     Mental Status: He is alert and oriented to person, place, and time.  Psychiatric:        Attention and Perception: Attention and perception normal.        Mood and Affect: Mood and affect normal.        Speech: Speech normal.        Behavior: Behavior normal. Behavior is cooperative.        Thought Content: Thought content normal.        Cognition and Memory: Cognition and memory normal.        Judgment: Judgment normal.   Review of Systems  Constitutional: Negative.   HENT: Negative.    Eyes: Negative.   Respiratory: Negative.    Cardiovascular: Negative.   Gastrointestinal: Negative.   Genitourinary: Negative.   Musculoskeletal: Negative.   Skin: Negative.   Neurological: Negative.   Endo/Heme/Allergies: Negative.   Psychiatric/Behavioral:  Positive for substance abuse.   Blood pressure (!) 145/89, pulse 95, temperature 97.8 F (36.6 C), temperature source Oral, resp. rate 18, SpO2 100 %. There is no height or weight on file to calculate BMI.  Treatment Plan Summary: Patient reviewed with Dr. Lucianne Muss. Laboratory studies reviewed  and unremarkable including CBC, CMP and TSH.  EKG completed, QT/QTcB measures 398/467. Lipid profile includes elevated cholesterol, triglycerides and VLDL.  Daily contact with patient to assess and evaluate symptoms and progress in treatment CIWA/ Ativan protocol initiated. Medications: -Albuterol 108 mcg inhaler 2 puffs every 6 as needed/wheezing, shortness of  breath -Hydroxyzine 25 mg every 6 as needed/anxiety -Loperamide capsule 2 to 4 mg oral as needed/diarrhea for 72 hours -Lorazepam 1 mg every 6 as needed CIWA greater than 10 -Multivitamin 1 tablet daily -Nicotine patch 21 mg transdermal daily -Olanzapine Zydis 5 mg nightly/mood -Ondansetron 4 mg every 6 as needed/nausea, vomiting for 72 hours -Thiamine D1 injection 100 mg IM once -Thiamine tablet 100 mg oral daily  Patient remains voluntarily at [facility] behavioral health, facility based crisis unit awaiting substance use treatment.  Disposition social work actively seeking residential substance use treatment at this time.  Lenard Lance, FNP 09/01/2020 10:22 AM

## 2020-09-01 NOTE — ED Notes (Signed)
Refuse breakfast.

## 2020-09-01 NOTE — Progress Notes (Signed)
Earlier intervention of ativan effective as patient symptoms has decreased and was able to attend groups.

## 2020-09-01 NOTE — ED Notes (Signed)
Pt sleeping at present, no distress noted.  Monitoring for safety. 

## 2020-09-01 NOTE — ED Notes (Signed)
Patient was given PRN Tylenol 650 mg for back pain (8 out of 10) and Vistaril (25 mg) requested for anxiety and restlessness. Patient is resting quietly in bed. Attended the group at 8:30 p.m. and participated with other patients. Calm and cooperative. Requested medication early and early snack.

## 2020-09-01 NOTE — ED Notes (Signed)
Pt appears to be asleep respirations 14 bpm and easy no distress noted.

## 2020-09-01 NOTE — ED Notes (Signed)
Patient is resting quietly in room. States he has constant pain in lower back 8 out of 10. Patient denies SI and HI.  Will continue to monitor for safety.

## 2020-09-01 NOTE — Clinical Social Work Psych Note (Addendum)
CSW Update  CSW met with patient for introduction and to begin discussions regarding discharge planning. Patient shared that he came to the [facility] to seek substance abuse treatment services. Patient shared that he was recently discharged from [facility] and did not feel that his admission was beneficial.   Patient requested for CSW to contact [facility] to follow up on a referral that was made on his behalf yesterday evening while in the ED/observation. Patient reports that if [facility] does not have a bed, for CSW to refer him to [facility] as alternatives for possible treatment.   12:00pm  - [facility] Referral made. CSW has attempted to contact admission's coordinator [email or web address].K via phone to determine any updates regarding patient's referral. There was no answer. CSW left HIPPA compliant voicemail. CSW also referred the patient's referral packet again, to ensure referral was received. Followed by an email requesting contact to determine patient's possible treatment. CSW will inform patient's treatment team of any updates regarding placement.   [facility] Referral made with admission. CSW awaiting response/decision on whether patient meets criteria for a screening admission appointment. CSW will inform patient's treatment team of any updates regarding placement.   - [facility]  Referral will be sent on patient's behalf. CSW will inform patient's treatment team of any updates regarding placement.     CSW will continue to follow for possible placement and other needs identified prior to discharge.     Radonna Ricker, MSW, LCSW Clinical Education officer, museum ([place]) [facility]

## 2020-09-02 ENCOUNTER — Encounter (HOSPITAL_COMMUNITY): Payer: Self-pay | Admitting: Student

## 2020-09-02 DIAGNOSIS — F259 Schizoaffective disorder, unspecified: Secondary | ICD-10-CM | POA: Diagnosis not present

## 2020-09-02 DIAGNOSIS — Z20822 Contact with and (suspected) exposure to covid-19: Secondary | ICD-10-CM | POA: Diagnosis not present

## 2020-09-02 DIAGNOSIS — F1020 Alcohol dependence, uncomplicated: Secondary | ICD-10-CM | POA: Diagnosis not present

## 2020-09-02 DIAGNOSIS — Z79899 Other long term (current) drug therapy: Secondary | ICD-10-CM | POA: Diagnosis not present

## 2020-09-02 MED ORDER — OLANZAPINE 5 MG PO TBDP
7.5000 mg | ORAL_TABLET | Freq: Every day | ORAL | Status: DC
  Administered 2020-09-02: 7.5 mg via ORAL
  Filled 2020-09-02: qty 21

## 2020-09-02 NOTE — ED Notes (Signed)
Pt sleeping in no acute distress. RR even and unlabored. Safety maintained. 

## 2020-09-02 NOTE — ED Notes (Signed)
Given lunch

## 2020-09-02 NOTE — ED Notes (Signed)
Pt sitting in dining room. A&O x4, calm and cooperative. Pt denies current SI/HI/AVH. Pt denies any immediate needs. No signs of acute distress noted. Will continue to monitor for safety.

## 2020-09-02 NOTE — Group Therapy Note (Signed)
Staff asked the patient to express how he is feeling.  Patient says he doesn't feel that the plan for him to go to a short term program is good enough. He thinks he needs at least 6 months.   Patient says he knows he has to make up in his mind that he wants to change and until he does the change will not happen.  Patient is making progress

## 2020-09-02 NOTE — ED Notes (Signed)
Patient is resting quietly with eyes closed. There are no signs or symptoms of distress. Breathing is regular and unlabored. Will continue to monitor.

## 2020-09-02 NOTE — ED Provider Notes (Signed)
Behavioral Health Progress Note  Date and Time: 09/02/2020 1:02 PM Name: Ricky Watson MRN:  [id number]  Subjective:  "I am feeling alright."   Patient seen and examined face to face by this provider and chart reviewed. On evaluation, patient is alert and oriented x 4. His thought process is logical and speech is coherent. His mood is "anxious" and affect is somewhat depressed. He denies suicidal ideations. He denies homicidal ideations. He denies auditory and visual hallucinations. He does not appear to be responding to internal or external stimuli. He reports alcohol withdrawal symptoms of tremors and nausea. He reports having mood swings. He describes his mood swings as episodes of feeling mad for no reason, depressed, and then okay. He rates his depression today at a 0. He rates his anxiety 7 (10 being worst). He reports having racing thoughts and excessive worrying at bedtime. We discussed increasing the Zyprexa at bedtime to help with his mood. He is agreeable to the stated plan. He states that his discharge plans are to go to [facility] for residential treatment on Tuesday for a 30-day program and then go to a 32-month program for substance abuse treatment. Per CSW note, patient has been accepted to [facility] for a screening appointment for residential treatment on Tuesday, 09/05/20, at 9:00am.   Diagnosis:  Final diagnoses:  Alcohol abuse with intoxication (HCC)    Total Time spent with patient: 30 minutes  Past Psychiatric History: Hx of schizoaffective dx,alcohol induced mood dx and alcohol use dx Past Medical History:  Past Medical History:  Diagnosis Date   Asthma    History reviewed. No pertinent surgical history. Family History: History reviewed. No pertinent family history. Family Psychiatric  History: Patient does not report a family hx of mental illness Social History: patient reports alcohol use Social History   Substance and Sexual Activity  Alcohol Use Not  Currently     Social History   Substance and Sexual Activity  Drug Use Not Currently    Social History   Socioeconomic History   Marital status: Divorced    Spouse name: Not on file   Number of children: Not on file   Years of education: Not on file   Highest education level: Not on file  Occupational History   Not on file  Tobacco Use   Smoking status: Every Day    Packs/day: 1.00    Types: Cigarettes   Smokeless tobacco: Current  Substance and Sexual Activity   Alcohol use: Not Currently   Drug use: Not Currently   Sexual activity: Not Currently  Other Topics Concern   Not on file  Social History Narrative   Not on file   Social Determinants of Health   Financial Resource Strain: Not on file  Food Insecurity: Not on file  Transportation Needs: Not on file  Physical Activity: Not on file  Stress: Not on file  Social Connections: Not on file   SDOH:  SDOH Screenings   Alcohol Screen: Low Risk    Last Alcohol Screening Score (AUDIT): 2  Depression (PHQ2-9): Low Risk    PHQ-2 Score: 4  Financial Resource Strain: Not on file  Food Insecurity: Not on file  Housing: Not on file  Physical Activity: Not on file  Social Connections: Not on file  Stress: Not on file  Tobacco Use: High Risk   Smoking Tobacco Use: Every Day   Smokeless Tobacco Use: Current  Transportation Needs: Not on file   Additional Social History:  Pain Medications: see MAR Prescriptions: see MAR Over the Counter: see MAR History of alcohol / drug use?: Yes (Drank 1 small bottle of wine today - sober 9 mos prior to today - 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 - 4-5 days 1 - Last Use / Amount: today, states he drank 8-25 oz beers 1 - Method of Aquiring: used last pay check 1- Route of Use: NA    Sleep: Good  Appetite:  Fair  Current Medications:  Current Facility-Administered Medications  Medication Dose Route Frequency Provider Last Rate Last Admin   acetaminophen (TYLENOL) tablet 650 mg  650 mg Oral Q6H PRN Park PopeJi, Andrew, MD   650 mg at 09/01/20 2036   albuterol (VENTOLIN HFA) 108 (90 Base) MCG/ACT inhaler 2 puff  2 puff Inhalation Q6H PRN Lenard LanceAllen, Tina L, FNP       alum & mag hydroxide-simeth (MAALOX/MYLANTA) 200-200-20 MG/5ML suspension 30 mL  30 mL Oral Q4H PRN Park PopeJi, Andrew, MD       hydrOXYzine (ATARAX/VISTARIL) tablet 25 mg  25 mg Oral Q6H PRN Lenard LanceAllen, Tina L, FNP   25 mg at 09/02/20 0955   loperamide (IMODIUM) capsule 2-4 mg  2-4 mg Oral PRN Lenard LanceAllen, Tina L, FNP       LORazepam (ATIVAN) tablet 1 mg  1 mg Oral Q6H PRN Lenard LanceAllen, Tina L, FNP   1 mg at 09/01/20 1003   magnesium hydroxide (MILK OF MAGNESIA) suspension 30 mL  30 mL Oral Daily PRN Park PopeJi, Andrew, MD       multivitamin with minerals tablet 1 tablet  1 tablet Oral Daily Lenard LanceAllen, Tina L, FNP   1 tablet at 09/02/20 0955   nicotine (NICODERM CQ - dosed in mg/24 hours) patch 21 mg  21 mg Transdermal Daily Lenard LanceAllen, Tina L, FNP   21 mg at 09/02/20 1003   OLANZapine zydis (ZYPREXA) disintegrating tablet 7.5 mg  7.5 mg Oral QHS Enrika Aguado L, NP       ondansetron (ZOFRAN-ODT) disintegrating tablet 4 mg  4 mg Oral Q6H PRN Lenard LanceAllen, Tina L, FNP       thiamine tablet 100 mg  100 mg Oral Daily Lenard LanceAllen, Tina L, FNP   100 mg at 09/02/20 [id number]   Current Outpatient Medications  Medication Sig Dispense Refill   albuterol (VENTOLIN HFA) 108 (90 Base) MCG/ACT inhaler Inhale 2 puffs into the lungs every 6 (six) hours as needed for wheezing or shortness of breath. 1 each 0   Aspirin-Acetaminophen-Caffeine (GOODY HEADACHE PO) Take 1 packet by mouth every 6 (six) hours as  needed (For headache or back pain.).     risperiDONE (RISPERDAL) 0.5 MG tablet Take 1 tablet (0.5 mg total) by mouth 2 (two) times daily. 60 tablet 0    Labs  Lab Results:  Admission on 08/31/2020  Component Date Value Ref Range Status   SARS Coronavirus 2 by RT PCR 08/31/2020 NEGATIVE  NEGATIVE Final   Comment: (NOTE) SARS-CoV-2 target nucleic acids are NOT DETECTED.  The SARS-CoV-2 RNA is generally detectable in upper respiratory specimens during the acute phase of infection. The lowest concentration of SARS-CoV-2 viral copies this assay can detect is  138 copies/mL. A negative result does not preclude SARS-Cov-2 infection and should not be used as the sole basis for treatment or other patient management decisions. A negative result may occur with  improper specimen collection/handling, submission of specimen other than nasopharyngeal swab, presence of viral mutation(s) within the areas targeted by this assay, and inadequate number of viral copies(<138 copies/mL). A negative result must be combined with clinical observations, patient history, and epidemiological information. The expected result is Negative.  Fact Sheet for Patients:  BloggerCourse.com  Fact Sheet for Healthcare Providers:  SeriousBroker.it  This test is no                          t yet approved or cleared by the [place] FDA and  has been authorized for detection and/or diagnosis of SARS-CoV-2 by FDA under an Emergency Use Authorization (EUA). This EUA will remain  in effect (meaning this test can be used) for the duration of the COVID-19 declaration under Section 564(b)(1) of the Act, 21 U.S.C.section 360bbb-3(b)(1), unless the authorization is terminated  or revoked sooner.       Influenza A by PCR 08/31/2020 NEGATIVE  NEGATIVE Final   Influenza B by PCR 08/31/2020 NEGATIVE  NEGATIVE Final   Comment: (NOTE) The Xpert Xpress SARS-CoV-2/FLU/RSV plus  assay is intended as an aid in the diagnosis of influenza from Nasopharyngeal swab specimens and should not be used as a sole basis for treatment. Nasal washings and aspirates are unacceptable for Xpert Xpress SARS-CoV-2/FLU/RSV testing.  Fact Sheet for Patients: BloggerCourse.com  Fact Sheet for Healthcare Providers: SeriousBroker.it  This test is not yet approved or cleared by the [place] FDA and has been authorized for detection and/or diagnosis of SARS-CoV-2 by FDA under an Emergency Use Authorization (EUA). This EUA will remain in effect (meaning this test can be used) for the duration of the COVID-19 declaration under Section 564(b)(1) of the Act, 21 U.S.C. section 360bbb-3(b)(1), unless the authorization is terminated or revoked.  Performed at [facility], [address] [place], [place] [address]    WBC 08/31/2020 6.4  4.0 - 10.5 K/uL Final   RBC 08/31/2020 4.74  4.22 - 5.81 MIL/uL Final   Hemoglobin 08/31/2020 15.1  13.0 - 17.0 g/dL Final   HCT 42/70/6237 43.3  39.0 - 52.0 % Final   MCV 08/31/2020 91.4  80.0 - 100.0 fL Final   MCH 08/31/2020 31.9  26.0 - 34.0 pg Final   MCHC 08/31/2020 34.9  30.0 - 36.0 g/dL Final   RDW 62/83/1517 14.1  11.5 - 15.5 % Final   Platelets 08/31/2020 323  150 - 400 K/uL Final   nRBC 08/31/2020 0.0  0.0 - 0.2 % Final   Neutrophils Relative % 08/31/2020 67  % Final   Neutro Abs 08/31/2020 4.3  1.7 - 7.7 K/uL Final   Lymphocytes Relative 08/31/2020 15  % Final   Lymphs Abs 08/31/2020 1.0  0.7 - 4.0 K/uL Final   Monocytes Relative 08/31/2020 14  % Final   Monocytes Absolute 08/31/2020 0.9  0.1 - 1.0 K/uL Final   Eosinophils Relative 08/31/2020 2  % Final   Eosinophils Absolute 08/31/2020 0.1  0.0 - 0.5 K/uL Final   Basophils Relative 08/31/2020 1  % Final   Basophils Absolute 08/31/2020 0.1  0.0 - 0.1 K/uL Final   Immature Granulocytes 08/31/2020 1  % Final   Abs Immature  Granulocytes 08/31/2020 0.06  0.00 - 0.07 K/uL Final  Performed at [facility], [address], [place], [place] [address]   Sodium 08/31/2020 137  135 - 145 mmol/L Final   Potassium 08/31/2020 3.6  3.5 - 5.1 mmol/L Final   Chloride 08/31/2020 101  98 - 111 mmol/L Final   CO2 08/31/2020 29  22 - 32 mmol/L Final   Glucose, Bld 08/31/2020 90  70 - 99 mg/dL Final   Glucose reference range applies only to samples taken after fasting for at least 8 hours.   BUN 08/31/2020 6  6 - 20 mg/dL Final   Creatinine, Ser 08/31/2020 0.69  0.61 - 1.24 mg/dL Final   Calcium 91/47/8295 8.8 (A) 8.9 - 10.3 mg/dL Final   Total Protein 62/13/0865 6.6  6.5 - 8.1 g/dL Final   Albumin 78/46/9629 3.7  3.5 - 5.0 g/dL Final   AST 52/84/1324 21  15 - 41 U/L Final   ALT 08/31/2020 17  0 - 44 U/L Final   Alkaline Phosphatase 08/31/2020 65  38 - 126 U/L Final   Total Bilirubin 08/31/2020 0.5  0.3 - 1.2 mg/dL Final   GFR, Estimated 08/31/2020 >60  >60 mL/min Final   Comment: (NOTE) Calculated using the CKD-EPI Creatinine Equation (2021)    Anion gap 08/31/2020 7  5 - 15 Final   Performed at [facility], [address], [place], [place] [address]   Alcohol, Ethyl (B) 08/31/2020 62 (A) <10 mg/dL Final   Comment: (NOTE) Lowest detectable limit for serum alcohol is 10 mg/dL.  For medical purposes only. Performed at [facility], [address] [place], [place] [address]    Cholesterol 08/31/2020 213 (A) 0 - 200 mg/dL Final   Triglycerides 64/40/3474 371 (A) <150 mg/dL Final   HDL 25/95/6387 56  >40 mg/dL Final   Total CHOL/HDL Ratio 08/31/2020 3.8  RATIO Final   VLDL 08/31/2020 74 (A) 0 - 40 mg/dL Final   LDL Cholesterol 08/31/2020 83  0 - 99 mg/dL Final   Comment:        Total Cholesterol/HDL:CHD Risk Coronary Heart Disease Risk Table                     Men   Women  1/2 Average Risk   3.4   3.3  Average Risk       5.0   4.4  2 X Average Risk   9.6   7.1  3 X Average Risk  23.4   11.0         Use the calculated Patient Ratio above and the CHD Risk Table to determine the patient's CHD Risk.        ATP III CLASSIFICATION (LDL):  <100     mg/dL   Optimal  564-332  mg/dL   Near or Above                    Optimal  130-159  mg/dL   Borderline  951-884  mg/dL   High  >166     mg/dL   Very High Performed at [facility], [address], [place], [place] [address]    TSH 08/31/2020 0.740  0.350 - 4.500 uIU/mL Final   Comment: Performed by a 3rd Generation assay with a functional sensitivity of <=0.01 uIU/mL. Performed at [facility], [address], [place], [place] [address]    SARSCOV2ONAVIRUS 2 AG 08/31/2020 NEGATIVE  NEGATIVE Final   Comment: (NOTE) SARS-CoV-2 antigen NOT  DETECTED.   Negative results are presumptive.  Negative results do not preclude SARS-CoV-2 infection and should not be used as the sole basis for treatment or other patient management decisions, including infection  control decisions, particularly in the presence of clinical signs and  symptoms consistent with COVID-19, or in those who have been in contact with the virus.  Negative results must be combined with clinical observations, patient history, and epidemiological information. The expected result is Negative.  Fact Sheet for Patients: https://www.jennings-kim.com/  Fact Sheet for Healthcare Providers: https://alexander-rogers.biz/  This test is not yet approved or cleared by the [place] FDA and  has been authorized for detection and/or diagnosis of SARS-CoV-2 by FDA under an Emergency Use Authorization (EUA).  This EUA will remain in effect (meaning this test can be used) for the duration of  the COV                          ID-19 declaration under Section 564(b)(1) of the Act, 21 U.S.C. section 360bbb-3(b)(1), unless the authorization is terminated or revoked sooner.    Admission on 08/24/2020, Discharged on 08/26/2020  Component Date Value  Ref Range Status   Hgb A1c MFr Bld 08/25/2020 5.8 (A) 4.8 - 5.6 % Final   Comment: (NOTE) Pre diabetes:          5.7%-6.4%  Diabetes:              >6.4%  Glycemic control for   <7.0% adults with diabetes    Mean Plasma Glucose 08/25/2020 119.76  mg/dL Final   Performed at [facility], [address] [place], [place] [address]   Cholesterol 08/25/2020 211 (A) 0 - 200 mg/dL Final   Triglycerides 69/62/9528 71  <150 mg/dL Final   HDL 41/32/4401 73  >40 mg/dL Final   Total CHOL/HDL Ratio 08/25/2020 2.9  RATIO Final   VLDL 08/25/2020 14  0 - 40 mg/dL Final   LDL Cholesterol 08/25/2020 124 (A) 0 - 99 mg/dL Final   Comment:        Total Cholesterol/HDL:CHD Risk Coronary Heart Disease Risk Table                     Men   Women  1/2 Average Risk   3.4   3.3  Average Risk       5.0   4.4  2 X Average Risk   9.6   7.1  3 X Average Risk  23.4   11.0        Use the calculated Patient Ratio above and the CHD Risk Table to determine the patient's CHD Risk.        ATP III CLASSIFICATION (LDL):  <100     mg/dL   Optimal  027-253  mg/dL   Near or Above                    Optimal  130-159  mg/dL   Borderline  664-403  mg/dL   High  >474     mg/dL   Very High Performed at [facility], [address] [place], [place] [address]    TSH 08/25/2020 1.835  0.350 - 4.500 uIU/mL Final   Comment: Performed by a 3rd Generation assay with a functional sensitivity of <=0.01 uIU/mL. Performed at [facility], [address] [place], [place] [address]   Admission on 08/23/2020, Discharged on 08/24/2020  Component  Date Value Ref Range Status   SARS Coronavirus 2 by RT PCR 08/23/2020 NEGATIVE  NEGATIVE Final   Comment: (NOTE) SARS-CoV-2 target nucleic acids are NOT DETECTED.  The SARS-CoV-2 RNA is generally detectable in upper respiratory specimens during the acute phase of infection. The lowest concentration of SARS-CoV-2 viral copies this assay can  detect is 138 copies/mL. A negative result does not preclude SARS-Cov-2 infection and should not be used as the sole basis for treatment or other patient management decisions. A negative result may occur with  improper specimen collection/handling, submission of specimen other than nasopharyngeal swab, presence of viral mutation(s) within the areas targeted by this assay, and inadequate number of viral copies(<138 copies/mL). A negative result must be combined with clinical observations, patient history, and epidemiological information. The expected result is Negative.  Fact Sheet for Patients:  BloggerCourse.com  Fact Sheet for Healthcare Providers:  SeriousBroker.it  This test is no                          t yet approved or cleared by the [place] FDA and  has been authorized for detection and/or diagnosis of SARS-CoV-2 by FDA under an Emergency Use Authorization (EUA). This EUA will remain  in effect (meaning this test can be used) for the duration of the COVID-19 declaration under Section 564(b)(1) of the Act, 21 U.S.C.section 360bbb-3(b)(1), unless the authorization is terminated  or revoked sooner.       Influenza A by PCR 08/23/2020 NEGATIVE  NEGATIVE Final   Influenza B by PCR 08/23/2020 NEGATIVE  NEGATIVE Final   Comment: (NOTE) The Xpert Xpress SARS-CoV-2/FLU/RSV plus assay is intended as an aid in the diagnosis of influenza from Nasopharyngeal swab specimens and should not be used as a sole basis for treatment. Nasal washings and aspirates are unacceptable for Xpert Xpress SARS-CoV-2/FLU/RSV testing.  Fact Sheet for Patients: BloggerCourse.com  Fact Sheet for Healthcare Providers: SeriousBroker.it  This test is not yet approved or cleared by the [place] FDA and has been authorized for detection and/or diagnosis of SARS-CoV-2 by FDA under an Emergency  Use Authorization (EUA). This EUA will remain in effect (meaning this test can be used) for the duration of the COVID-19 declaration under Section 564(b)(1) of the Act, 21 U.S.C. section 360bbb-3(b)(1), unless the authorization is terminated or revoked.  Performed at [facility], [address], [place], [place] [address]    Sodium 08/23/2020 139  135 - 145 mmol/L Final   Potassium 08/23/2020 3.8  3.5 - 5.1 mmol/L Final   Chloride 08/23/2020 105  98 - 111 mmol/L Final   CO2 08/23/2020 26  22 - 32 mmol/L Final   Glucose, Bld 08/23/2020 99  70 - 99 mg/dL Final   Glucose reference range applies only to samples taken after fasting for at least 8 hours.   BUN 08/23/2020 8  6 - 20 mg/dL Final   Creatinine, Ser 08/23/2020 0.69  0.61 - 1.24 mg/dL Final   Calcium 67/20/9470 8.5 (A) 8.9 - 10.3 mg/dL Final   Total Protein 96/28/3662 7.2  6.5 - 8.1 g/dL Final   Albumin 94/76/5465 3.9  3.5 - 5.0 g/dL Final   AST 03/54/6568 38  15 - 41 U/L Final   ALT 08/23/2020 34  0 - 44 U/L Final   Alkaline Phosphatase 08/23/2020 82  38 - 126 U/L Final   Total Bilirubin 08/23/2020 0.5  0.3 - 1.2 mg/dL Final   GFR, Estimated  08/23/2020 >60  >60 mL/min Final   Comment: (NOTE) Calculated using the CKD-EPI Creatinine Equation (2021)    Anion gap 08/23/2020 8  5 - 15 Final   Performed at [facility], [address], [place], [place] [address]   Alcohol, Ethyl (B) 08/23/2020 33 (A) <10 mg/dL Final   Comment: (NOTE) Lowest detectable limit for serum alcohol is 10 mg/dL.  For medical purposes only. Performed at [facility], [address], [place], [place] [address]    Opiates 08/23/2020 NONE DETECTED  NONE DETECTED Final   Cocaine 08/23/2020 NONE DETECTED  NONE DETECTED Final   Benzodiazepines 08/23/2020 NONE DETECTED  NONE DETECTED Final   Amphetamines 08/23/2020 NONE DETECTED  NONE DETECTED Final   Tetrahydrocannabinol 08/23/2020 NONE DETECTED   NONE DETECTED Final   Barbiturates 08/23/2020 NONE DETECTED  NONE DETECTED Final   Comment: (NOTE) DRUG SCREEN FOR MEDICAL PURPOSES ONLY.  IF CONFIRMATION IS NEEDED FOR ANY PURPOSE, NOTIFY LAB WITHIN 5 DAYS.  LOWEST DETECTABLE LIMITS FOR URINE DRUG SCREEN Drug Class                     Cutoff (ng/mL) Amphetamine and metabolites    1000 Barbiturate and metabolites    200 Benzodiazepine                 200 Tricyclics and metabolites     300 Opiates and metabolites        300 Cocaine and metabolites        300 THC                            50 Performed at [facility], [address] [place], [place] [address]    WBC 08/23/2020 6.2  4.0 - 10.5 K/uL Final   RBC 08/23/2020 5.10  4.22 - 5.81 MIL/uL Final   Hemoglobin 08/23/2020 15.9  13.0 - 17.0 g/dL Final   HCT 21/30/8657 46.5  39.0 - 52.0 % Final   MCV 08/23/2020 91.2  80.0 - 100.0 fL Final   MCH 08/23/2020 31.2  26.0 - 34.0 pg Final   MCHC 08/23/2020 34.2  30.0 - 36.0 g/dL Final   RDW 84/69/6295 13.7  11.5 - 15.5 % Final   Platelets 08/23/2020 294  150 - 400 K/uL Final   nRBC 08/23/2020 0.0  0.0 - 0.2 % Final   Neutrophils Relative % 08/23/2020 65  % Final   Neutro Abs 08/23/2020 4.0  1.7 - 7.7 K/uL Final   Lymphocytes Relative 08/23/2020 18  % Final   Lymphs Abs 08/23/2020 1.1  0.7 - 4.0 K/uL Final   Monocytes Relative 08/23/2020 12  % Final   Monocytes Absolute 08/23/2020 0.8  0.1 - 1.0 K/uL Final   Eosinophils Relative 08/23/2020 3  % Final   Eosinophils Absolute 08/23/2020 0.2  0.0 - 0.5 K/uL Final   Basophils Relative 08/23/2020 1  % Final   Basophils Absolute 08/23/2020 0.1  0.0 - 0.1 K/uL Final   Immature Granulocytes 08/23/2020 1  % Final   Abs Immature Granulocytes 08/23/2020 0.05  0.00 - 0.07 K/uL Final   Performed at [facility], [address], [place], [place] [address]  Admission on 08/01/2020, Discharged on 08/01/2020  Component Date Value Ref Range Status   INFLUENZA  A ANTIGEN, POC 08/01/2020 NEGATIVE  NEGATIVE Final   INFLUENZA B ANTIGEN, POC 08/01/2020 NEGATIVE  NEGATIVE Final  Blood Alcohol level:  Lab Results  Component Value Date   ETH 62 (H) 08/31/2020   ETH 33 (H) 08/23/2020    Metabolic Disorder Labs: Lab Results  Component Value Date   HGBA1C 5.8 (H) 08/25/2020   MPG 119.76 08/25/2020   No results found for: PROLACTIN Lab Results  Component Value Date   CHOL 213 (H) 08/31/2020   TRIG 371 (H) 08/31/2020   HDL 56 08/31/2020   CHOLHDL 3.8 08/31/2020   VLDL 74 (H) 08/31/2020   LDLCALC 83 08/31/2020   LDLCALC 124 (H) 08/25/2020    Therapeutic Lab Levels: No results found for: LITHIUM No results found for: VALPROATE No components found for:  CBMZ  Physical Findings   AIMS    Flowsheet Row Admission (Discharged) from OP Visit from 04/10/2019 in [facility]  AIMS Total Score 0      AUDIT    Flowsheet Row [facility] Admission (Discharged) from OP Visit from 04/10/2019 in [facility]  Alcohol Use Disorder Identification Test Final Score (AUDIT) 2 23      PHQ2-9    Flowsheet Row ED from 08/31/2020 in [facility]  PHQ-2 Total Score 2  PHQ-9 Total Score 4      Flowsheet Row [facility] ED from 08/23/2020 in [facility]  C-SSRS RISK CATEGORY No Risk Moderate Risk High Risk        Musculoskeletal  Strength & Muscle Tone: within normal limits Gait & Station: normal Patient leans: N/A  Psychiatric Specialty Exam  Presentation  General Appearance: Appropriate for Environment  Eye Contact:Fair  Speech:Clear and Coherent  Speech Volume:Normal  Handedness:Right   Mood and Affect   Mood:Depressed  Affect:Congruent   Thought Process  Thought Processes:Coherent; Goal Directed  Descriptions of Associations:Intact  Orientation:Full (Time, Place and Person)  Thought Content:WDL  Diagnosis of Schizophrenia or Schizoaffective disorder in past: Yes  Duration of Psychotic Symptoms: Greater than six months   Hallucinations:Hallucinations: None  Ideas of Reference:None  Suicidal Thoughts:Suicidal Thoughts: No  Homicidal Thoughts:Homicidal Thoughts: No   Sensorium  Memory:Immediate Fair; Immediate Poor; Remote Fair  Judgment:Fair  Insight:Fair   Executive Functions  Concentration:Fair  Attention Span:Fair  Recall:Fair  Fund of Knowledge:Fair  Language:Fair   Psychomotor Activity  Psychomotor Activity:Psychomotor Activity: Normal   Assets  Assets:Communication Skills; Desire for Improvement; Physical Health; Leisure Time   Sleep  Sleep:Sleep: Fair   No data recorded  Physical Exam  Physical Exam HENT:     Head: Normocephalic.     Nose: Nose normal.  Eyes:     Conjunctiva/sclera: Conjunctivae normal.  Cardiovascular:     Rate and Rhythm: Normal rate.  Pulmonary:     Effort: Pulmonary effort is normal.  Musculoskeletal:     Cervical back: Normal range of motion.  Neurological:     Mental Status: He is alert and oriented to person, place, and time.   Review of Systems  Constitutional: Negative.   HENT: Negative.    Eyes: Negative.   Respiratory: Negative.    Cardiovascular: Negative.   Gastrointestinal:  Positive for nausea.  Genitourinary: Negative.   Musculoskeletal: Negative.   Skin: Negative.   Neurological:  Positive for tremors.  Endo/Heme/Allergies: Negative.   Psychiatric/Behavioral:  Positive for substance abuse. Negative for hallucinations and suicidal ideas.   Blood  pressure (!) 136/98, pulse 92, temperature 97.6 F (36.4 C), temperature source Oral, resp. rate 18, SpO2 100 %. There is no height or weight on  file to calculate BMI.  Treatment Plan Summary: Daily contact with patient to assess and evaluate symptoms and progress in treatment, Medication management, and Plan Patient admitted voluntarily to the [facility].  Labs reviewed,  including EKG QTC 467 (08/31/20) Continue medication regimen:   multivitamin with minerals  1 tablet Oral Daily   nicotine  21 mg Transdermal Daily   OLANZapine zydis  7.5 mg Oral QHS   thiamine  100 mg Oral Daily  Ativan 1 mg po every 6 hours prn for CIWA >10 Increased zyprexa zydis from 5 mg to 7.5 mg QHS for mood stabilization.  Disposition: per CSW on 09/01/20: Patient has been accepted to [facility] for a screening appointment for residential treatment. Screening appointment is Tuesday, 09/05/20 at 9:00am. Patient will need a 14 day supply of medications, with a 30-day prescription (including 1 refill).   Graceson Nichelson L, NP 09/02/2020 1:02 PM

## 2020-09-02 NOTE — ED Notes (Addendum)
D:  Patient A&Ox4. Denies intent to harm self/others when asked. Denies A/VH. Patient denies any physical complaints when asked. Complained of increased anxiety upon awakening this am, relieved with Vistaril. No other concerns noted. CIWA 1.   A: Support and encouragement provided. Routine safety checks conducted according to facility protocol. Encouraged patient to notify staff if thoughts of harm toward self or others arise. Patient verbalize understanding and agreement.  R: Patient remains safe and patient verbally contracts for safety at this time. Will continue to monitor.

## 2020-09-02 NOTE — ED Notes (Signed)
Pt resting in no acute distress. RR even and unlabored. Safety maintained. 

## 2020-09-02 NOTE — ED Notes (Signed)
Given  breakfast

## 2020-09-02 NOTE — ED Notes (Signed)
Patient is resting quietly with eyes closed. No signs of distress or discomfort. Breathing is regular and unlabored. Will continue to monitor

## 2020-09-03 DIAGNOSIS — Z79899 Other long term (current) drug therapy: Secondary | ICD-10-CM | POA: Diagnosis not present

## 2020-09-03 DIAGNOSIS — F1020 Alcohol dependence, uncomplicated: Secondary | ICD-10-CM | POA: Diagnosis not present

## 2020-09-03 DIAGNOSIS — Z20822 Contact with and (suspected) exposure to covid-19: Secondary | ICD-10-CM | POA: Diagnosis not present

## 2020-09-03 DIAGNOSIS — F259 Schizoaffective disorder, unspecified: Secondary | ICD-10-CM | POA: Diagnosis not present

## 2020-09-03 MED ORDER — OLANZAPINE 7.5 MG PO TABS
7.5000 mg | ORAL_TABLET | Freq: Once | ORAL | Status: AC
  Administered 2020-09-03: 7.5 mg via ORAL
  Filled 2020-09-03: qty 1

## 2020-09-03 NOTE — ED Notes (Signed)
Pt given dinner  

## 2020-09-03 NOTE — ED Notes (Signed)
Pt sleeping in no acute distress. RR even and unlabored. Safety maintained. 

## 2020-09-03 NOTE — ED Notes (Signed)
Pt asleep in bed. Respirations even and unlabored. Will continue to monitor for safety. ?

## 2020-09-03 NOTE — ED Notes (Signed)
Pt resting in no acute distress. RR even and unlabored. Safety maintained. 

## 2020-09-03 NOTE — ED Notes (Signed)
Pt given lunch

## 2020-09-03 NOTE — ED Notes (Signed)
  While trying to pull the patients zyprexa from the pyxis on the [facility] side, there was an error message and the medication would not populate or allow me to pull it. I advised the patient that I messaged the pharmacist and will reach back out to him once they respond

## 2020-09-03 NOTE — ED Notes (Signed)
D:  Patient A&Ox4. Denies intent to harm self/others when asked. Denies A/VH. C/O increased anxiety earlier this am relieved with Vistaril 25mg . Patient denies any physical complaints when asked.     A: Support and encouragement provided. Routine safety checks conducted according to facility protocol. Encouraged patient to notify staff if thoughts of harm toward self or others arise. Patient verbalize understanding and agreement.   R: Patient remains safe and patient verbally contracts for safety at this time. Will continue to monitor.

## 2020-09-03 NOTE — ED Notes (Signed)
Pt given breakfast.

## 2020-09-03 NOTE — Group Therapy Note (Signed)
Self Care Sunday - Self Care  Date: 09/03/20  Type of Therapy/Therapeutic Modalities: Motivational Interviewing, CBT, Supportive   Participation Level: Active  Objectives: Facilitators will discuss with patients their perspectives on self-care and how it is demonstrated in their current lives. Patients will explore new possible self-care practices. Patients will begin to brainstorm a plan of action on how they will implement self-care at discharge.   Therapeutic Goals:   1. Patient will identify areas in which they lack practicing self-care.  2. Patient will explore possible activities and actions that will serve as self-care practices. 3. Patient will discuss how they plan to implement healthy self-care practices into their daily regiments.   Summary of Patient's Progress: Pt active in group session. Pt shared his triggers, warning signs, self-care routine, and coping strategies. Pt stated he would like to spend time with his children because they motivate him to stay sober. Pt gave positive feedback.

## 2020-09-03 NOTE — ED Notes (Signed)
Pt is in room sleeping, respirations are even an unlabored, environment check is complete will continue to monitor patient for safety

## 2020-09-03 NOTE — ED Notes (Signed)
Pt given snack. 

## 2020-09-03 NOTE — ED Notes (Signed)
Pt is currently sitting in the dining room eating a snack and watching television

## 2020-09-03 NOTE — ED Notes (Signed)
Pt in dayroom eating snack, interacting with peers. Denies concerns at present. Safety maintained.

## 2020-09-03 NOTE — ED Provider Notes (Signed)
Behavioral Health Progress Note  Date and Time: 09/03/2020 1:02 PM Name: Ricky Watson MRN:  [id number]  Subjective:  Patient seen and examined face to face by this provider and chart reviewed. On evaluation, patient is alert and oriented x 4. His thought process is logical and speech is coherent. His mood is dysphoric and affect congruent. He denies suicidal ideations. He denies homicidal ideations. He denies auditory and visual hallucinations. He does not appear to be responding to internal or external stimuli. He denies alcohol withdrawal symptoms.  He reports decreased episodes of mood swings. He continues to feel anxious throughout the day and will request vistaril. He reports improved sleep last night. He reports having a good appetite and states that he is "always hungry."  He reports tolerating the increase in Zyprexa at bedtime without any side effects. He reports that he is still planning to go to [facility] on Tuesday for substance abuse treatment. He denies any additional concerns at this time.  Diagnosis:  Final diagnoses:  Alcohol dependence with alcohol-induced mood disorder (HCC)    Total Time spent with patient: 20 minutes  Past Psychiatric History: Hx of schizoaffective dx,alcohol induced mood dx and alcohol use dx Past Medical History:  Past Medical History:  Diagnosis Date   Asthma    History reviewed. No pertinent surgical history. Family History: None reported. Family Psychiatric  History: No family hx reported  Social History: Alcohol use Social History   Substance and Sexual Activity  Alcohol Use Not Currently     Social History   Substance and Sexual Activity  Drug Use Not Currently    Social History   Socioeconomic History   Marital status: Divorced    Spouse name: Not on file   Number of children: Not on file   Years of education: Not on file   Highest education level: Not on file  Occupational History   Not on file  Tobacco Use   Smoking  status: Every Day    Packs/day: 1.00    Types: Cigarettes   Smokeless tobacco: Current  Substance and Sexual Activity   Alcohol use: Not Currently   Drug use: Not Currently   Sexual activity: Not Currently  Other Topics Concern   Not on file  Social History Narrative   Not on file   Social Determinants of Health   Financial Resource Strain: Not on file  Food Insecurity: Not on file  Transportation Needs: Not on file  Physical Activity: Not on file  Stress: Not on file  Social Connections: Not on file   SDOH:  SDOH Screenings   Alcohol Screen: Low Risk    Last Alcohol Screening Score (AUDIT): 2  Depression (PHQ2-9): Low Risk    PHQ-2 Score: 4  Financial Resource Strain: Not on file  Food Insecurity: Not on file  Housing: Not on file  Physical Activity: Not on file  Social Connections: Not on file  Stress: Not on file  Tobacco Use: High Risk   Smoking Tobacco Use: Every Day   Smokeless Tobacco Use: Current  Transportation Needs: Not on file   Additional Social History:    Pain Medications: see MAR Prescriptions: see MAR Over the Counter: see MAR History of alcohol / drug use?: Yes (Drank 1 small bottle of wine today - sober 9 mos prior to today - 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.) Longest period of sobriety (when/how long): 2 years Negative Consequences of Use: Personal relationships Withdrawal Symptoms: Patient aware of  relationship between substance abuse and physical/medical complications Name of Substance 1: ETOH 1 - Age of First Use: teen 1 - Amount (size/oz): varies 1 - Frequency: daily 1 - Duration: since d/c from [facility] on 08/26/20 - 4-5 days 1 - Last Use / Amount: today, states he drank 8-25 oz beers 1 - Method of Aquiring: used last pay check 1- Route of Use: NA   Sleep: Fair  Appetite:  Fair  Current Medications:  Current Facility-Administered Medications  Medication Dose Route Frequency Provider Last Rate Last Admin    acetaminophen (TYLENOL) tablet 650 mg  650 mg Oral Q6H PRN Park Pope, MD   650 mg at 09/02/20 2105   albuterol (VENTOLIN HFA) 108 (90 Base) MCG/ACT inhaler 2 puff  2 puff Inhalation Q6H PRN Lenard Lance, FNP       alum & mag hydroxide-simeth (MAALOX/MYLANTA) 200-200-20 MG/5ML suspension 30 mL  30 mL Oral Q4H PRN Park Pope, MD       hydrOXYzine (ATARAX/VISTARIL) tablet 25 mg  25 mg Oral Q6H PRN Lenard Lance, FNP   25 mg at 09/03/20 0947   loperamide (IMODIUM) capsule 2-4 mg  2-4 mg Oral PRN Lenard Lance, FNP       LORazepam (ATIVAN) tablet 1 mg  1 mg Oral Q6H PRN Lenard Lance, FNP   1 mg at 09/01/20 1003   magnesium hydroxide (MILK OF MAGNESIA) suspension 30 mL  30 mL Oral Daily PRN Park Pope, MD       multivitamin with minerals tablet 1 tablet  1 tablet Oral Daily Lenard Lance, FNP   1 tablet at 09/03/20 0947   nicotine (NICODERM CQ - dosed in mg/24 hours) patch 21 mg  21 mg Transdermal Daily Lenard Lance, FNP   21 mg at 09/03/20 0947   OLANZapine zydis (ZYPREXA) disintegrating tablet 7.5 mg  7.5 mg Oral QHS Merida Alcantar L, NP   7.5 mg at 09/02/20 2106   ondansetron (ZOFRAN-ODT) disintegrating tablet 4 mg  4 mg Oral Q6H PRN Lenard Lance, FNP       thiamine tablet 100 mg  100 mg Oral Daily Lenard Lance, FNP   100 mg at 09/03/20 4098   Current Outpatient Medications  Medication Sig Dispense Refill   albuterol (VENTOLIN HFA) 108 (90 Base) MCG/ACT inhaler Inhale 2 puffs into the lungs every 6 (six) hours as needed for wheezing or shortness of breath. 1 each 0   Aspirin-Acetaminophen-Caffeine (GOODY HEADACHE PO) Take 1 packet by mouth every 6 (six) hours as needed (For headache or back pain.).     risperiDONE (RISPERDAL) 0.5 MG tablet Take 1 tablet (0.5 mg total) by mouth 2 (two) times daily. 60 tablet 0    Labs  Lab Results:  Admission on 08/31/2020  Component Date Value Ref Range Status   SARS Coronavirus 2 by RT PCR 08/31/2020 NEGATIVE  NEGATIVE Final   Comment: (NOTE) SARS-CoV-2  target nucleic acids are NOT DETECTED.  The SARS-CoV-2 RNA is generally detectable in upper respiratory specimens during the acute phase of infection. The lowest concentration of SARS-CoV-2 viral copies this assay can detect is 138 copies/mL. A negative result does not preclude SARS-Cov-2 infection and should not be used as the sole basis for treatment or other patient management decisions. A negative result may occur with  improper specimen collection/handling, submission of specimen other than nasopharyngeal swab, presence of viral mutation(s) within the areas targeted by this assay, and inadequate number of viral copies(<138 copies/mL).  A negative result must be combined with clinical observations, patient history, and epidemiological information. The expected result is Negative.  Fact Sheet for Patients:  BloggerCourse.com  Fact Sheet for Healthcare Providers:  SeriousBroker.it  This test is no                          t yet approved or cleared by the [place] FDA and  has been authorized for detection and/or diagnosis of SARS-CoV-2 by FDA under an Emergency Use Authorization (EUA). This EUA will remain  in effect (meaning this test can be used) for the duration of the COVID-19 declaration under Section 564(b)(1) of the Act, 21 U.S.C.section 360bbb-3(b)(1), unless the authorization is terminated  or revoked sooner.       Influenza A by PCR 08/31/2020 NEGATIVE  NEGATIVE Final   Influenza B by PCR 08/31/2020 NEGATIVE  NEGATIVE Final   Comment: (NOTE) The Xpert Xpress SARS-CoV-2/FLU/RSV plus assay is intended as an aid in the diagnosis of influenza from Nasopharyngeal swab specimens and should not be used as a sole basis for treatment. Nasal washings and aspirates are unacceptable for Xpert Xpress SARS-CoV-2/FLU/RSV testing.  Fact Sheet for Patients: BloggerCourse.com  Fact Sheet for Healthcare  Providers: SeriousBroker.it  This test is not yet approved or cleared by the [place] FDA and has been authorized for detection and/or diagnosis of SARS-CoV-2 by FDA under an Emergency Use Authorization (EUA). This EUA will remain in effect (meaning this test can be used) for the duration of the COVID-19 declaration under Section 564(b)(1) of the Act, 21 U.S.C. section 360bbb-3(b)(1), unless the authorization is terminated or revoked.  Performed at [facility], [address] [place], [place] [address]    WBC 08/31/2020 6.4  4.0 - 10.5 K/uL Final   RBC 08/31/2020 4.74  4.22 - 5.81 MIL/uL Final   Hemoglobin 08/31/2020 15.1  13.0 - 17.0 g/dL Final   HCT 94/76/5465 43.3  39.0 - 52.0 % Final   MCV 08/31/2020 91.4  80.0 - 100.0 fL Final   MCH 08/31/2020 31.9  26.0 - 34.0 pg Final   MCHC 08/31/2020 34.9  30.0 - 36.0 g/dL Final   RDW 03/54/6568 14.1  11.5 - 15.5 % Final   Platelets 08/31/2020 323  150 - 400 K/uL Final   nRBC 08/31/2020 0.0  0.0 - 0.2 % Final   Neutrophils Relative % 08/31/2020 67  % Final   Neutro Abs 08/31/2020 4.3  1.7 - 7.7 K/uL Final   Lymphocytes Relative 08/31/2020 15  % Final   Lymphs Abs 08/31/2020 1.0  0.7 - 4.0 K/uL Final   Monocytes Relative 08/31/2020 14  % Final   Monocytes Absolute 08/31/2020 0.9  0.1 - 1.0 K/uL Final   Eosinophils Relative 08/31/2020 2  % Final   Eosinophils Absolute 08/31/2020 0.1  0.0 - 0.5 K/uL Final   Basophils Relative 08/31/2020 1  % Final   Basophils Absolute 08/31/2020 0.1  0.0 - 0.1 K/uL Final   Immature Granulocytes 08/31/2020 1  % Final   Abs Immature Granulocytes 08/31/2020 0.06  0.00 - 0.07 K/uL Final   Performed at [facility], [address], [place], [place] [address]   Sodium 08/31/2020 137  135 - 145 mmol/L Final   Potassium 08/31/2020 3.6  3.5 - 5.1 mmol/L Final   Chloride 08/31/2020 101  98 - 111 mmol/L Final   CO2 08/31/2020 29  22 - 32 mmol/L Final   Glucose,  Bld  08/31/2020 90  70 - 99 mg/dL Final   Glucose reference range applies only to samples taken after fasting for at least 8 hours.   BUN 08/31/2020 6  6 - 20 mg/dL Final   Creatinine, Ser 08/31/2020 0.69  0.61 - 1.24 mg/dL Final   Calcium 16/10/960408/18/2022 8.8 (A) 8.9 - 10.3 mg/dL Final   Total Protein 54/09/811908/18/2022 6.6  6.5 - 8.1 g/dL Final   Albumin 14/78/295608/18/2022 3.7  3.5 - 5.0 g/dL Final   AST 21/30/865708/18/2022 21  15 - 41 U/L Final   ALT 08/31/2020 17  0 - 44 U/L Final   Alkaline Phosphatase 08/31/2020 65  38 - 126 U/L Final   Total Bilirubin 08/31/2020 0.5  0.3 - 1.2 mg/dL Final   GFR, Estimated 08/31/2020 >60  >60 mL/min Final   Comment: (NOTE) Calculated using the CKD-EPI Creatinine Equation (2021)    Anion gap 08/31/2020 7  5 - 15 Final   Performed at [facility], [address] [place], [place] [address]   Alcohol, Ethyl (B) 08/31/2020 62 (A) <10 mg/dL Final   Comment: (NOTE) Lowest detectable limit for serum alcohol is 10 mg/dL.  For medical purposes only. Performed at [facility], [address] [place], [place] [address]    Cholesterol 08/31/2020 213 (A) 0 - 200 mg/dL Final   Triglycerides 41/32/440108/18/2022 371 (A) <150 mg/dL Final   HDL 02/72/536608/18/2022 56  >40 mg/dL Final   Total CHOL/HDL Ratio 08/31/2020 3.8  RATIO Final   VLDL 08/31/2020 74 (A) 0 - 40 mg/dL Final   LDL Cholesterol 08/31/2020 83  0 - 99 mg/dL Final   Comment:        Total Cholesterol/HDL:CHD Risk Coronary Heart Disease Risk Table                     Men   Women  1/2 Average Risk   3.4   3.3  Average Risk       5.0   4.4  2 X Average Risk   9.6   7.1  3 X Average Risk  23.4   11.0        Use the calculated Patient Ratio above and the CHD Risk Table to determine the patient's CHD Risk.        ATP III CLASSIFICATION (LDL):  <100     mg/dL   Optimal  440-347100-129  mg/dL   Near or Above                    Optimal  130-159  mg/dL   Borderline  425-956160-189  mg/dL   High  >387>190     mg/dL   Very High Performed at [facility], [address] [place], [place] [address]    TSH 08/31/2020 0.740  0.350 - 4.500 uIU/mL Final   Comment: Performed by a 3rd Generation assay with a functional sensitivity of <=0.01 uIU/mL. Performed at [facility], [address] [place], [place] [address]    SARSCOV2ONAVIRUS 2 AG 08/31/2020 NEGATIVE  NEGATIVE Final   Comment: (NOTE) SARS-CoV-2 antigen NOT DETECTED.   Negative results are presumptive.  Negative results do not preclude SARS-CoV-2 infection and should not be used as the sole basis for treatment or other patient management decisions, including infection  control decisions, particularly in the presence of clinical signs and  symptoms consistent with COVID-19, or in those who have been in contact with the  virus.  Negative results must be combined with clinical observations, patient history, and epidemiological information. The expected result is Negative.  Fact Sheet for Patients: https://www.jennings-kim.com/  Fact Sheet for Healthcare Providers: https://alexander-rogers.biz/  This test is not yet approved or cleared by the [place] FDA and  has been authorized for detection and/or diagnosis of SARS-CoV-2 by FDA under an Emergency Use Authorization (EUA).  This EUA will remain in effect (meaning this test can be used) for the duration of  the COV                          ID-19 declaration under Section 564(b)(1) of the Act, 21 U.S.C. section 360bbb-3(b)(1), unless the authorization is terminated or revoked sooner.    Admission on 08/24/2020, Discharged on 08/26/2020  Component Date Value Ref Range Status   Hgb A1c MFr Bld 08/25/2020 5.8 (A) 4.8 - 5.6 % Final   Comment: (NOTE) Pre diabetes:          5.7%-6.4%  Diabetes:              >6.4%  Glycemic control for   <7.0% adults with diabetes    Mean Plasma Glucose 08/25/2020 119.76  mg/dL Final   Performed at [facility], [address], [place], [place]  [address]   Cholesterol 08/25/2020 211 (A) 0 - 200 mg/dL Final   Triglycerides 78/29/5621 71  <150 mg/dL Final   HDL 30/86/5784 73  >40 mg/dL Final   Total CHOL/HDL Ratio 08/25/2020 2.9  RATIO Final   VLDL 08/25/2020 14  0 - 40 mg/dL Final   LDL Cholesterol 08/25/2020 124 (A) 0 - 99 mg/dL Final   Comment:        Total Cholesterol/HDL:CHD Risk Coronary Heart Disease Risk Table                     Men   Women  1/2 Average Risk   3.4   3.3  Average Risk       5.0   4.4  2 X Average Risk   9.6   7.1  3 X Average Risk  23.4   11.0        Use the calculated Patient Ratio above and the CHD Risk Table to determine the patient's CHD Risk.        ATP III CLASSIFICATION (LDL):  <100     mg/dL   Optimal  696-295  mg/dL   Near or Above                    Optimal  130-159  mg/dL   Borderline  284-132  mg/dL   High  >440     mg/dL   Very High Performed at [facility], [address] [place], [place] [address]    TSH 08/25/2020 1.835  0.350 - 4.500 uIU/mL Final   Comment: Performed by a 3rd Generation assay with a functional sensitivity of <=0.01 uIU/mL. Performed at [facility], [address], [place], [place] [address]   Admission on 08/23/2020, Discharged on 08/24/2020  Component Date Value Ref Range Status   SARS Coronavirus 2 by RT PCR 08/23/2020 NEGATIVE  NEGATIVE Final   Comment: (NOTE) SARS-CoV-2 target nucleic acids are NOT DETECTED.  The SARS-CoV-2 RNA is generally detectable in upper respiratory specimens during the acute phase of infection. The lowest concentration of SARS-CoV-2 viral copies this assay can detect is 138  copies/mL. A negative result does not preclude SARS-Cov-2 infection and should not be used as the sole basis for treatment or other patient management decisions. A negative result may occur with  improper specimen collection/handling, submission of specimen other than nasopharyngeal swab, presence of viral mutation(s)  within the areas targeted by this assay, and inadequate number of viral copies(<138 copies/mL). A negative result must be combined with clinical observations, patient history, and epidemiological information. The expected result is Negative.  Fact Sheet for Patients:  BloggerCourse.com  Fact Sheet for Healthcare Providers:  SeriousBroker.it  This test is no                          t yet approved or cleared by the [place] FDA and  has been authorized for detection and/or diagnosis of SARS-CoV-2 by FDA under an Emergency Use Authorization (EUA). This EUA will remain  in effect (meaning this test can be used) for the duration of the COVID-19 declaration under Section 564(b)(1) of the Act, 21 U.S.C.section 360bbb-3(b)(1), unless the authorization is terminated  or revoked sooner.       Influenza A by PCR 08/23/2020 NEGATIVE  NEGATIVE Final   Influenza B by PCR 08/23/2020 NEGATIVE  NEGATIVE Final   Comment: (NOTE) The Xpert Xpress SARS-CoV-2/FLU/RSV plus assay is intended as an aid in the diagnosis of influenza from Nasopharyngeal swab specimens and should not be used as a sole basis for treatment. Nasal washings and aspirates are unacceptable for Xpert Xpress SARS-CoV-2/FLU/RSV testing.  Fact Sheet for Patients: BloggerCourse.com  Fact Sheet for Healthcare Providers: SeriousBroker.it  This test is not yet approved or cleared by the [place] FDA and has been authorized for detection and/or diagnosis of SARS-CoV-2 by FDA under an Emergency Use Authorization (EUA). This EUA will remain in effect (meaning this test can be used) for the duration of the COVID-19 declaration under Section 564(b)(1) of the Act, 21 U.S.C. section 360bbb-3(b)(1), unless the authorization is terminated or revoked.  Performed at [facility], [address], [place],  [place] [address]    Sodium 08/23/2020 139  135 - 145 mmol/L Final   Potassium 08/23/2020 3.8  3.5 - 5.1 mmol/L Final   Chloride 08/23/2020 105  98 - 111 mmol/L Final   CO2 08/23/2020 26  22 - 32 mmol/L Final   Glucose, Bld 08/23/2020 99  70 - 99 mg/dL Final   Glucose reference range applies only to samples taken after fasting for at least 8 hours.   BUN 08/23/2020 8  6 - 20 mg/dL Final   Creatinine, Ser 08/23/2020 0.69  0.61 - 1.24 mg/dL Final   Calcium 41/32/4401 8.5 (A) 8.9 - 10.3 mg/dL Final   Total Protein 02/72/5366 7.2  6.5 - 8.1 g/dL Final   Albumin 44/03/4740 3.9  3.5 - 5.0 g/dL Final   AST 59/56/3875 38  15 - 41 U/L Final   ALT 08/23/2020 34  0 - 44 U/L Final   Alkaline Phosphatase 08/23/2020 82  38 - 126 U/L Final   Total Bilirubin 08/23/2020 0.5  0.3 - 1.2 mg/dL Final   GFR, Estimated 08/23/2020 >60  >60 mL/min Final   Comment: (NOTE) Calculated using the CKD-EPI Creatinine Equation (2021)    Anion gap 08/23/2020 8  5 - 15 Final   Performed at [facility], [address], [place], [place] [address]   Alcohol, Ethyl (B) 08/23/2020 33 (A) <10 mg/dL Final   Comment: (NOTE)  Lowest detectable limit for serum alcohol is 10 mg/dL.  For medical purposes only. Performed at [facility], [address], [place], [place] [address]    Opiates 08/23/2020 NONE DETECTED  NONE DETECTED Final   Cocaine 08/23/2020 NONE DETECTED  NONE DETECTED Final   Benzodiazepines 08/23/2020 NONE DETECTED  NONE DETECTED Final   Amphetamines 08/23/2020 NONE DETECTED  NONE DETECTED Final   Tetrahydrocannabinol 08/23/2020 NONE DETECTED  NONE DETECTED Final   Barbiturates 08/23/2020 NONE DETECTED  NONE DETECTED Final   Comment: (NOTE) DRUG SCREEN FOR MEDICAL PURPOSES ONLY.  IF CONFIRMATION IS NEEDED FOR ANY PURPOSE, NOTIFY LAB WITHIN 5 DAYS.  LOWEST DETECTABLE LIMITS FOR URINE DRUG SCREEN Drug Class                     Cutoff (ng/mL) Amphetamine and metabolites     1000 Barbiturate and metabolites    200 Benzodiazepine                 200 Tricyclics and metabolites     300 Opiates and metabolites        300 Cocaine and metabolites        300 THC                            50 Performed at [facility], [address] [place], [place] [address]    WBC 08/23/2020 6.2  4.0 - 10.5 K/uL Final   RBC 08/23/2020 5.10  4.22 - 5.81 MIL/uL Final   Hemoglobin 08/23/2020 15.9  13.0 - 17.0 g/dL Final   HCT 69/62/9528 46.5  39.0 - 52.0 % Final   MCV 08/23/2020 91.2  80.0 - 100.0 fL Final   MCH 08/23/2020 31.2  26.0 - 34.0 pg Final   MCHC 08/23/2020 34.2  30.0 - 36.0 g/dL Final   RDW 41/32/4401 13.7  11.5 - 15.5 % Final   Platelets 08/23/2020 294  150 - 400 K/uL Final   nRBC 08/23/2020 0.0  0.0 - 0.2 % Final   Neutrophils Relative % 08/23/2020 65  % Final   Neutro Abs 08/23/2020 4.0  1.7 - 7.7 K/uL Final   Lymphocytes Relative 08/23/2020 18  % Final   Lymphs Abs 08/23/2020 1.1  0.7 - 4.0 K/uL Final   Monocytes Relative 08/23/2020 12  % Final   Monocytes Absolute 08/23/2020 0.8  0.1 - 1.0 K/uL Final   Eosinophils Relative 08/23/2020 3  % Final   Eosinophils Absolute 08/23/2020 0.2  0.0 - 0.5 K/uL Final   Basophils Relative 08/23/2020 1  % Final   Basophils Absolute 08/23/2020 0.1  0.0 - 0.1 K/uL Final   Immature Granulocytes 08/23/2020 1  % Final   Abs Immature Granulocytes 08/23/2020 0.05  0.00 - 0.07 K/uL Final   Performed at [facility], [address] [place], [place] [address]  Admission on 08/01/2020, Discharged on 08/01/2020  Component Date Value Ref Range Status   INFLUENZA A ANTIGEN, POC 08/01/2020 NEGATIVE  NEGATIVE Final   INFLUENZA B ANTIGEN, POC 08/01/2020 NEGATIVE  NEGATIVE Final    Blood Alcohol level:  Lab Results  Component Value Date   ETH 62 (H) 08/31/2020   ETH 33 (H) 08/23/2020    Metabolic Disorder Labs: Lab Results  Component Value Date   HGBA1C 5.8 (H) 08/25/2020   MPG 119.76  08/25/2020   No results found for: PROLACTIN Lab Results  Component Value Date  CHOL 213 (H) 08/31/2020   TRIG 371 (H) 08/31/2020   HDL 56 08/31/2020   CHOLHDL 3.8 08/31/2020   VLDL 74 (H) 08/31/2020   LDLCALC 83 08/31/2020   LDLCALC 124 (H) 08/25/2020    Therapeutic Lab Levels: No results found for: LITHIUM No results found for: VALPROATE No components found for:  CBMZ  Physical Findings   AIMS    Flowsheet Row Admission (Discharged) from OP Visit from 04/10/2019 in [facility]  AIMS Total Score 0      AUDIT    Flowsheet Row [facility] Admission (Discharged) from OP Visit from 04/10/2019 in [facility]  Alcohol Use Disorder Identification Test Final Score (AUDIT) 2 23      PHQ2-9    Flowsheet Row ED from 08/31/2020 in [facility]  PHQ-2 Total Score 2  PHQ-9 Total Score 4      Flowsheet Row ED from 08/31/2020 in [facility] Admission (Discharged) from [facility] ED from 08/23/2020 in [facility]  C-SSRS RISK CATEGORY No Risk Moderate Risk High Risk        Musculoskeletal  Strength & Muscle Tone: within normal limits Gait & Station: normal Patient leans: N/A  Psychiatric Specialty Exam  Presentation  General Appearance: Appropriate for Environment  Eye Contact:Fair  Speech:Clear and Coherent  Speech Volume:Normal  Handedness:Right   Mood and Affect  Mood:Dysphoric  Affect:Congruent   Thought Process  Thought Processes:Coherent; Goal Directed  Descriptions of Associations:Intact  Orientation:Full (Time, Place and Person)  Thought Content:WDL  Diagnosis of Schizophrenia or Schizoaffective disorder in past: Yes  Duration of Psychotic Symptoms: Greater than six months    Hallucinations:Hallucinations: None  Ideas of Reference:None  Suicidal Thoughts:Suicidal Thoughts: No  Homicidal Thoughts:Homicidal Thoughts: No   Sensorium  Memory:Immediate Fair; Recent Fair; Remote Fair  Judgment:Fair  Insight:Fair   Executive Functions  Concentration:Fair  Attention Span:Fair  Recall:Fair  Fund of Knowledge:Fair  Language:Fair   Psychomotor Activity  Psychomotor Activity:Psychomotor Activity: Normal   Assets  Assets:Communication Skills; Desire for Improvement; Leisure Time; Physical Health   Sleep  Sleep:Sleep: Fair Number of Hours of Sleep: 5   No data recorded  Physical Exam  Physical Exam HENT:     Head: Normocephalic.     Nose: Nose normal.  Eyes:     Conjunctiva/sclera: Conjunctivae normal.  Cardiovascular:     Rate and Rhythm: Normal rate.  Pulmonary:     Effort: Pulmonary effort is normal.  Musculoskeletal:        General: Normal range of motion.     Cervical back: Normal range of motion.  Neurological:     Mental Status: He is alert and oriented to person, place, and time.   Review of Systems  Constitutional: Negative.   HENT: Negative.    Eyes: Negative.   Respiratory: Negative.    Cardiovascular: Negative.   Skin: Negative.   Neurological: Negative.   Endo/Heme/Allergies: Negative.   Psychiatric/Behavioral:  Positive for depression and substance abuse.   Blood pressure (!) 125/94, pulse 90, temperature 97.6 F (36.4 C), temperature source Oral, resp. rate 18, SpO2 99 %. There is no height or weight on file to calculate BMI.  Treatment Plan Summary: Daily contact with patient to assess and evaluate symptoms and progress in treatment, Medication management, and Plan Patient admitted voluntarily to the [facility]  Crisis.   Labs reviewed,  including EKG QTC 467 (08/31/20)  Continue medication regimen:   multivitamin with minerals  1 tablet Oral Daily   nicotine  21 mg  Transdermal Daily   OLANZapine zydis  7.5 mg Oral QHS   thiamine  100 mg Oral Daily  Ativan 1 mg po every 6 hours prn for CIWA >10  Disposition: per CSW on 09/01/20: Patient has been accepted to [facility] for a screening appointment for residential treatment. Screening appointment is Tuesday, 09/05/20 at 9:00am. Patient will need a 14 day supply of medications, with a 30-day prescription (including 1 refill).   Layla Barter, NP 09/03/2020 1:02 PM

## 2020-09-03 NOTE — ED Notes (Signed)
This nurse went to check the pyxis at the [facility] to see if the zyprexa the patient was ordered is available there, however it wasn't. This nurse advised the patient that a message has been sent to the pharmacist and that I am awaiting the pharmacist to respond and provide the zyprexa.

## 2020-09-03 NOTE — ED Notes (Signed)
The message this nurse sent to pharmacist has been received and a new order for zyprexa 7.5 mg has been put in and was available in the pyxis and given to patient.

## 2020-09-03 NOTE — ED Notes (Signed)
Pt requested his zyprexa and tylenol, this nurse advised the patient that his zyprexa is due at 2200, however he can have it an hour early.

## 2020-09-04 DIAGNOSIS — Z20822 Contact with and (suspected) exposure to covid-19: Secondary | ICD-10-CM | POA: Diagnosis not present

## 2020-09-04 DIAGNOSIS — F259 Schizoaffective disorder, unspecified: Secondary | ICD-10-CM | POA: Diagnosis not present

## 2020-09-04 DIAGNOSIS — Z79899 Other long term (current) drug therapy: Secondary | ICD-10-CM | POA: Diagnosis not present

## 2020-09-04 DIAGNOSIS — F1020 Alcohol dependence, uncomplicated: Secondary | ICD-10-CM | POA: Diagnosis not present

## 2020-09-04 MED ORDER — OLANZAPINE 7.5 MG PO TABS: 7.5000 mg | ORAL_TABLET | Freq: Every day | ORAL | 1 refills | Status: AC

## 2020-09-04 MED ORDER — HYDROXYZINE HCL 25 MG PO TABS
25.0000 mg | ORAL_TABLET | Freq: Three times a day (TID) | ORAL | Status: DC | PRN
  Administered 2020-09-04: 25 mg via ORAL
  Filled 2020-09-04 (×2): qty 30
  Filled 2020-09-04: qty 1

## 2020-09-04 MED ORDER — OLANZAPINE 5 MG PO TBDP: 7.5000 mg | ORAL_TABLET | Freq: Every day | ORAL | 1 refills | Status: DC

## 2020-09-04 MED ORDER — HYDROXYZINE HCL 25 MG PO TABS: 25.0000 mg | ORAL_TABLET | Freq: Three times a day (TID) | ORAL | 1 refills | Status: AC | PRN

## 2020-09-04 NOTE — ED Notes (Signed)
Patient out for breakfast and then resting on bed.  Stated he's going to [facility] tomorrow.  Patient stated his goal to stay on his medications and to stay positive.  Denied SI, HI, AVH.  Rated anxiety and depression 7-8/10.

## 2020-09-04 NOTE — ED Provider Notes (Signed)
FBC/OBS ASAP Discharge Summary  Date and Time: 09/04/2020 11:13 AM  Name: Ricky Watson  MRN:  [id number]   Discharge Diagnoses:  Final diagnoses:  Alcohol dependence with alcohol-induced mood disorder (HCC)  Schizoaffective disorder, unspecified type (HCC)    Subjective:  Patient seen and chart reviewed. Patient denies SI/HI/AVH. He states that he would like to go to the [facility] in [place] instead of [place]. He states that he has went to the [facility] before and that he prefers the substance use program there comapred to [facility] since it is longer. He states that he previously went [facility] and participated in their substance use treatment program and states that it is  6  months long; he states that the last time he attended it and found it to be helpful.  Pt is provided with 14 day sample and well as Rx's for 30 days with one refill.   Stay Summary:  Patient presented to the [facility] on 8/18 via [employer] requesting assistance with rehab. Pt denied SI/HI/AVH. He was admitted to the [facility] and restarted on his home medications including 5 mg zyprexa zydis for  schizoaffective disorder which he was previously prescribed during recent [facility] admission (see chart for additional details). Zyprexa zydis was increased to 7.5 mg on 8/20; he tolerated this medication well without SE. Patient was accepted to [facility] for the morning of 8/23; however, [facility] in [place] had availability and he preferred to go to the [facility] over [place] above for details.Patient was discharged to the [facility] in [place] via safe transport with prescriptions and samples.   Total Time spent with patient: 20 minutes  Past Psychiatric History: see H&P Past Medical History:  Past Medical History:  Diagnosis Date   Asthma    History reviewed. No pertinent surgical history. Family History: History reviewed. No pertinent family history. Family Psychiatric History: see  H&P Social History:  Social History   Substance and Sexual Activity  Alcohol Use Not Currently     Social History   Substance and Sexual Activity  Drug Use Not Currently    Social History   Socioeconomic History   Marital status: Divorced    Spouse name: Not on file   Number of children: Not on file   Years of education: Not on file   Highest education level: Not on file  Occupational History   Not on file  Tobacco Use   Smoking status: Every Day    Packs/day: 1.00    Types: Cigarettes   Smokeless tobacco: Current  Substance and Sexual Activity   Alcohol use: Not Currently   Drug use: Not Currently   Sexual activity: Not Currently  Other Topics Concern   Not on file  Social History Narrative   Not on file   Social Determinants of Health   Financial Resource Strain: Not on file  Food Insecurity: Not on file  Transportation Needs: Not on file  Physical Activity: Not on file  Stress: Not on file  Social Connections: Not on file   SDOH:  SDOH Screenings   Alcohol Screen: Low Risk    Last Alcohol Screening Score (AUDIT): 2  Depression (PHQ2-9): Low Risk    PHQ-2 Score: 4  Financial Resource Strain: Not on file  Food Insecurity: Not on file  Housing: Not on file  Physical Activity: Not on file  Social Connections: Not on file  Stress: Not on file  Tobacco Use: High Risk   Smoking Tobacco Use:  Every Day   Smokeless Tobacco Use: Current  Transportation Needs: Not on file    Tobacco Cessation:  A prescription for an FDA-approved tobacco cessation medication was declined  Current Medications:  Current Facility-Administered Medications  Medication Dose Route Frequency Provider Last Rate Last Admin   acetaminophen (TYLENOL) tablet 650 mg  650 mg Oral Q6H PRN Park Pope, MD   650 mg at 09/04/20 0904   albuterol (VENTOLIN HFA) 108 (90 Base) MCG/ACT inhaler 2 puff  2 puff Inhalation Q6H PRN Lenard Lance, FNP       alum & mag hydroxide-simeth (MAALOX/MYLANTA)  200-200-20 MG/5ML suspension 30 mL  30 mL Oral Q4H PRN Park Pope, MD       hydrOXYzine (ATARAX/VISTARIL) tablet 25 mg  25 mg Oral TID PRN Ardis Hughs, NP   25 mg at 09/04/20 3354   magnesium hydroxide (MILK OF MAGNESIA) suspension 30 mL  30 mL Oral Daily PRN Park Pope, MD       multivitamin with minerals tablet 1 tablet  1 tablet Oral Daily Lenard Lance, FNP   1 tablet at 09/03/20 0947   nicotine (NICODERM CQ - dosed in mg/24 hours) patch 21 mg  21 mg Transdermal Daily Lenard Lance, FNP   21 mg at 09/04/20 0904   OLANZapine zydis (ZYPREXA) disintegrating tablet 7.5 mg  7.5 mg Oral QHS White, Patrice L, NP   7.5 mg at 09/02/20 2106   thiamine tablet 100 mg  100 mg Oral Daily Lenard Lance, FNP   100 mg at 09/03/20 5625   Current Outpatient Medications  Medication Sig Dispense Refill   albuterol (VENTOLIN HFA) 108 (90 Base) MCG/ACT inhaler Inhale 2 puffs into the lungs every 6 (six) hours as needed for wheezing or shortness of breath. 1 each 0   Aspirin-Acetaminophen-Caffeine (GOODY HEADACHE PO) Take 1 packet by mouth every 6 (six) hours as needed (For headache or back pain.).     OLANZapine (ZYPREXA) 7.5 MG tablet Take 1 tablet (7.5 mg total) by mouth at bedtime. 30 tablet 1   hydrOXYzine (ATARAX/VISTARIL) 25 MG tablet Take 1 tablet (25 mg total) by mouth 3 (three) times daily as needed for anxiety. 30 tablet 1    PTA Medications: (Not in a hospital admission)   Musculoskeletal  Strength & Muscle Tone: within normal limits Gait & Station: normal Patient leans: N/A  Psychiatric Specialty Exam  Presentation  General Appearance: Appropriate for Environment; Casual  Eye Contact:Good  Speech:Clear and Coherent; Normal Rate  Speech Volume:Normal  Handedness:Right   Mood and Affect  Mood:Euthymic  Affect:Appropriate; Congruent   Thought Process  Thought Processes:Coherent; Goal Directed; Linear  Descriptions of Associations:Intact  Orientation:Full (Time, Place and  Person)  Thought Content:WDL  Diagnosis of Schizophrenia or Schizoaffective disorder in past: Yes  Duration of Psychotic Symptoms: Greater than six months   Hallucinations:Hallucinations: None  Ideas of Reference:None  Suicidal Thoughts:Suicidal Thoughts: No  Homicidal Thoughts:Homicidal Thoughts: No   Sensorium  Memory:Immediate Good; Recent Good; Remote Good  Judgment:Good  Insight:Good   Executive Functions  Concentration:Good  Attention Span:Good  Recall:Good  Fund of Knowledge:Good  Language:Good   Psychomotor Activity  Psychomotor Activity: Psychomotor Activity: Normal  Assets  Assets:Communication Skills; Desire for Improvement; Leisure Time; Physical Health   Sleep  Sleep:Sleep: Fair    No data recorded  Physical Exam  See SRA for physical exam and ROS  Blood pressure (!) 112/93, pulse 95, temperature 97.9 F (36.6 C), temperature source Oral, resp. rate 18,  SpO2 98 %. There is no height or weight on file to calculate BMI.  See SRA for full suicide risk assessment  Disposition: Holiday representative in [place]     Patient is discharged to the [facility] in [place] per request; per [employer] can provide assistance with substance use treatment. He was provided with 30 days and 1 refill of medications as well as 14 day sample in hand.  Patient is denying SI/HI/AVH and is requesting discharge to [facility] where he can obtain further treatment for substance use. He was also accepted to [facility]; however, he prefers to go to [facility].  He is no longer meeting criteria for [facility] and is appropriate for discharge.   Estella Husk, MD 09/04/2020, 11:13 AM

## 2020-09-04 NOTE — ED Notes (Signed)
Patient discharged.

## 2020-09-04 NOTE — ED Provider Notes (Signed)
[facility] Discharge Suicide Risk Assessment   Principal Problem: <principal problem not specified> Discharge Diagnoses: Active Problems:   Alcohol use disorder, severe, in early remission (HCC)   Total Time spent with patient: 20 minutes  Musculoskeletal: Strength & Muscle Tone: within normal limits Gait & Station: normal Patient leans: N/A  Psychiatric Specialty Exam  Presentation  General Appearance: Appropriate for Environment  Eye Contact:Fair  Speech:Clear and Coherent  Speech Volume:Normal  Handedness:Right   Mood and Affect  Mood:Dysphoric  Duration of Depression Symptoms: Less than two weeks  Affect:Congruent   Thought Process  Thought Processes:Coherent; Goal Directed  Descriptions of Associations:Intact  Orientation:Full (Time, Place and Person)  Thought Content:WDL  History of Schizophrenia/Schizoaffective disorder:Yes  Duration of Psychotic Symptoms:Greater than six months  Hallucinations:Hallucinations: None  Ideas of Reference:None  Suicidal Thoughts:Suicidal Thoughts: No  Homicidal Thoughts:Homicidal Thoughts: No   Sensorium  Memory:Immediate Fair; Recent Fair; Remote Fair  Judgment:Fair  Insight:Fair   Executive Functions  Concentration:Fair  Attention Span:Fair  Recall:Fair  Fund of Knowledge:Fair  Language:Fair   Psychomotor Activity  Psychomotor Activity: No data recorded  Assets  Assets:Communication Skills; Desire for Improvement; Leisure Time; Physical Health   Sleep  Sleep:Sleep: Fair   Physical Exam: Physical Exam Constitutional:      Appearance: Normal appearance. He is normal weight.  HENT:     Head: Normocephalic and atraumatic.  Eyes:     Extraocular Movements: Extraocular movements intact.  Pulmonary:     Effort: Pulmonary effort is normal.  Neurological:     General: No focal deficit present.     Mental Status: He is alert.   Review of Systems  Constitutional:  Negative for chills and  fever.  HENT:  Negative for hearing loss.   Eyes:  Negative for discharge and redness.  Respiratory:  Negative for cough.   Cardiovascular:  Negative for chest pain.  Gastrointestinal:  Negative for abdominal pain.  Musculoskeletal:  Negative for myalgias.  Neurological:  Negative for headaches.  Psychiatric/Behavioral:  Positive for substance abuse. Negative for suicidal ideas.   Blood pressure (!) 112/93, pulse 95, temperature 97.9 F (36.6 C), temperature source Oral, resp. rate 18, SpO2 98 %. There is no height or weight on file to calculate BMI.  Mental Status Per Nursing Assessment::   On Admission:     Demographic Factors:  Male, Caucasian, and Low socioeconomic status  Loss Factors: Financial problems/change in socioeconomic status  Historical Factors: Family history of mental illness or substance abuse, Impulsivity, and history of substance use disorder  Risk Reduction Factors:   Positive social support, Positive coping skills or problem solving skills, and motivated for treatment  Continued Clinical Symptoms:  Alcohol/Substance Abuse/Dependencies More than one psychiatric diagnosis Previous Psychiatric Diagnoses and Treatments  Cognitive Features That Contribute To Risk:  None    Suicide Risk:  Minimal: No identifiable suicidal ideation.  Patients presenting with no risk factors but with morbid ruminations; may be classified as minimal risk based on the severity of the depressive symptoms    Plan Of Care/Follow-up recommendations:  Activity:  as tolerated Diet:  regular Other:      Patient is instructed prior to discharge to: Take all medications as prescribed by his/her mental healthcare provider. Report any adverse effects and or reactions from the medicines to his/her outpatient provider promptly. Patient has been instructed & cautioned: To not engage in alcohol and or illegal drug use while on prescription medicines. In the event of worsening symptoms,  patient is instructed to call the  crisis hotline, 911 and or go to the nearest ED for appropriate evaluation and treatment of symptoms. To follow-up with his/her primary care provider for your other medical issues, concerns and or health care needs.   Patient is discharged to the [facility] in [place] per request; per [employer] can provide assistance with substance use treatment. He was provided with 30 days and 1 refill of medications as well as 14 day sample in hand.  Patient is denying SI/HI/AVH and is requesting discharge to [facility] where he can obtain further treatment for substance use. He was also accepted to [facility]; however, he prefers to go to [facility].  He is no longer meeting criteria for [facility] and is appropriate for discharge.   Estella Husk, MD 09/04/2020, 10:31 AM

## 2020-09-04 NOTE — Progress Notes (Signed)
Patient given AVS, scripts, and samples per pharmacy.  Stated that he felt safe and ready for discharge.  Ambulated independently to sally port without issue.  All belongings returned to patient.  Patient discharged in stable condition; no acute distress noted.

## 2020-09-04 NOTE — ED Notes (Signed)
Pt is in room sleeping, respirations are even and unlabored, environment check complete/secure, will continue to monitor patient for safety 

## 2020-10-09 NOTE — ED Provider Notes (Signed)
   [facility] Medical Screening Exam  MSE for 08/23/20   Final diagnosis Alcohol dependence with alcohol-induced mood disorder (HCC)  Schizoaffective disorder, unspecified type (HCC)

## 2022-06-26 IMAGING — CR DG CHEST 2V
2 series · 2 of 2 positions shown · non-contrast
Comparison: None.

CLINICAL DATA: 44-year-old male with cough.  Smoker.

EXAM:
CHEST - 2 VIEW

[w chest pa]
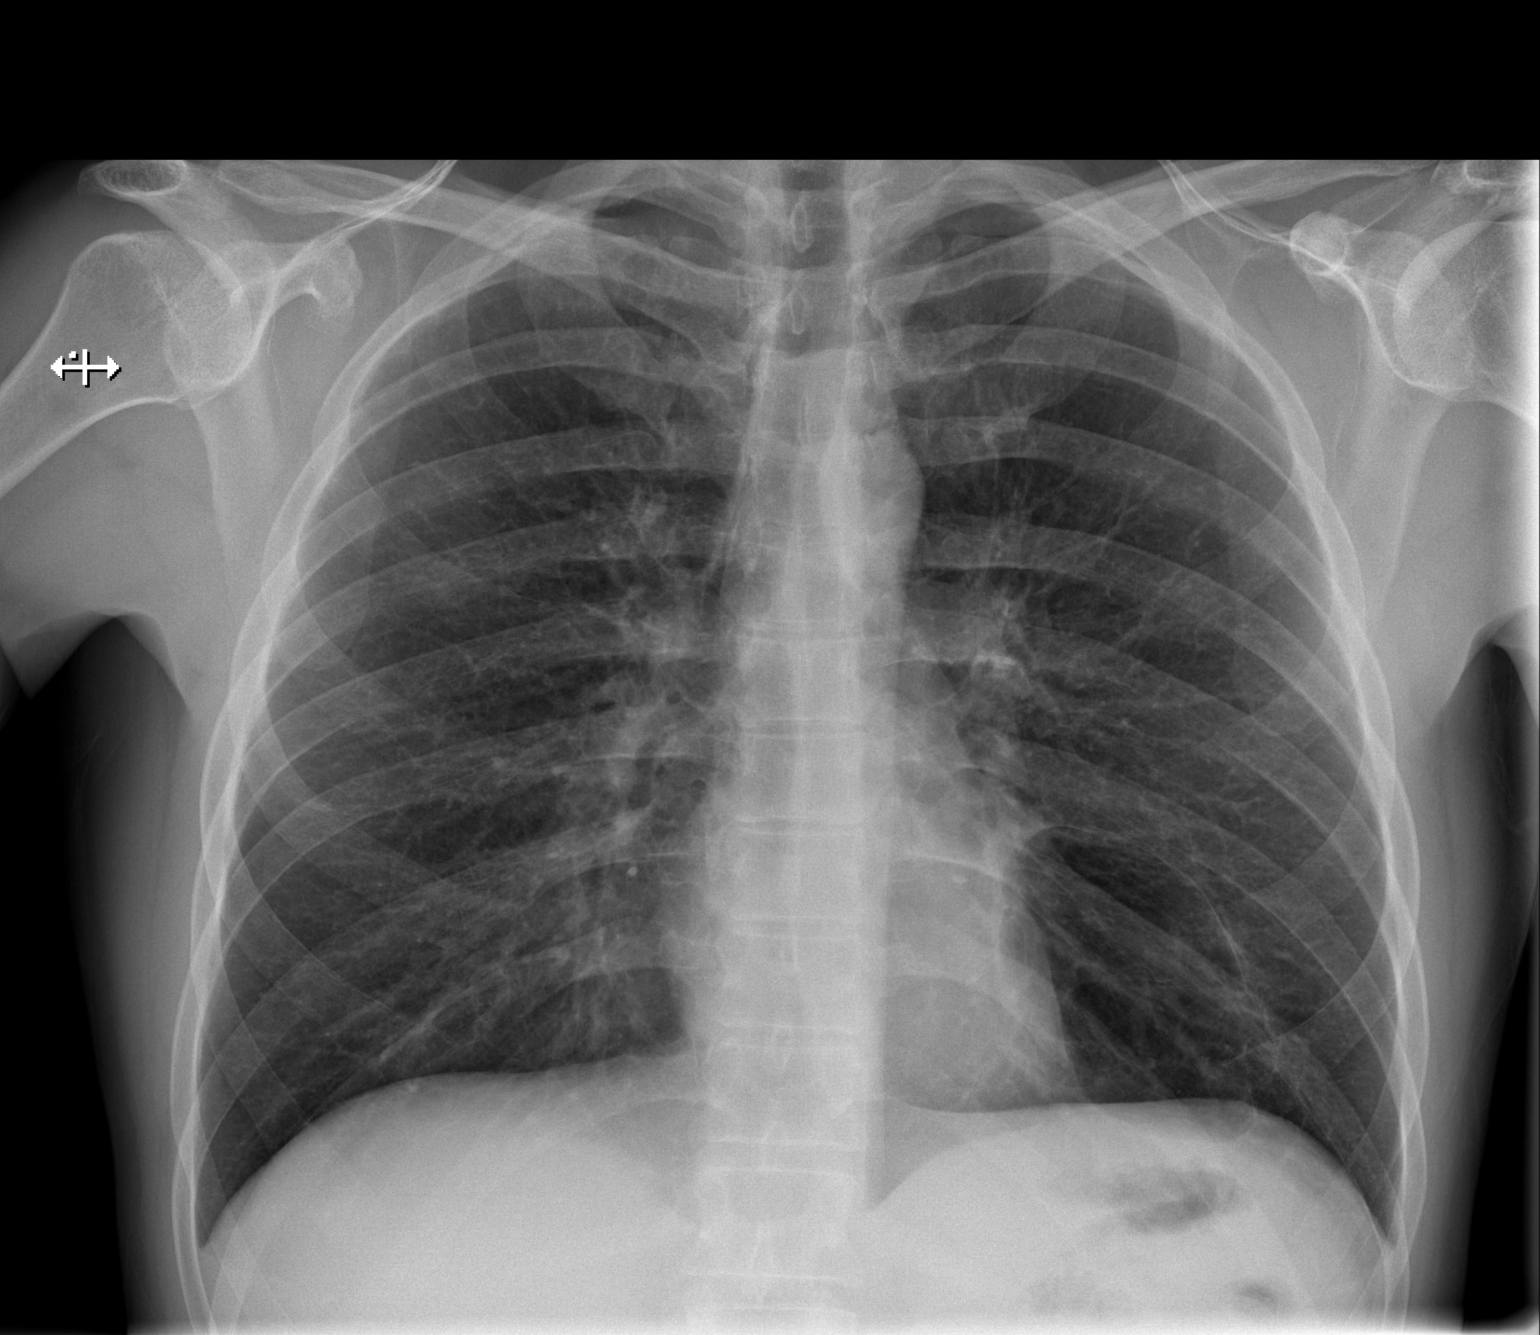

[w chest lat]
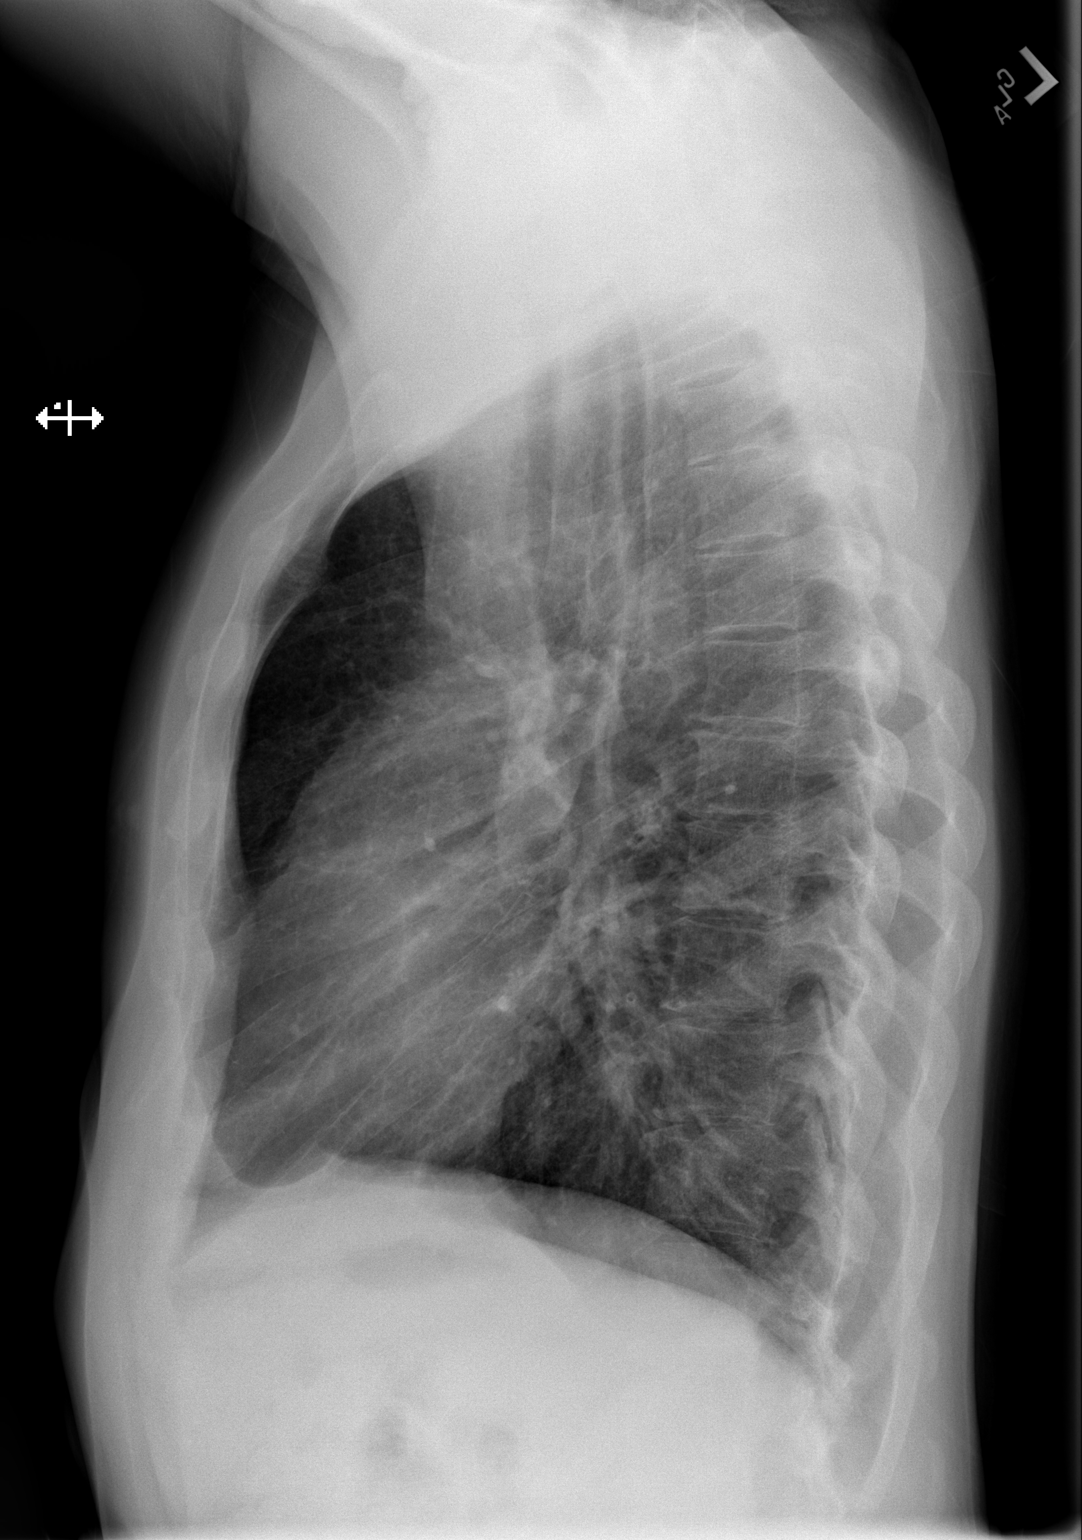

[2 of 2 positions shown; findings below may reference images not displayed]

FINDINGS: Normal cardiac size and mediastinal contours. Visualized tracheal
air column is within normal limits. Lungs appear mildly
hyperinflated. Symmetric coarse pulmonary interstitial opacity
bilaterally. No pneumothorax, pulmonary edema, pleural effusion or
confluent opacity. But there is evidence of bullous emphysema in the
lingula. No acute osseous abnormality identified. Negative visible
bowel gas pattern.
IMPRESSION: Mildly hyperinflated lungs with evidence of Bullous Emphysema in the
lingula.

Coarse bilateral pulmonary interstitial opacity, likely smoking
related. Viral/atypical respiratory infection felt less likely.
